# Patient Record
Sex: Male | Born: 1980 | Race: White | Hispanic: No | Marital: Single | Smoking: Current every day smoker
Health system: Southern US, Community
[De-identification: ages and names within clinical notes are randomized; demographics above are authoritative.]

## PROBLEM LIST (undated history)

## (undated) DIAGNOSIS — J45909 Unspecified asthma, uncomplicated: Secondary | ICD-10-CM

## (undated) DIAGNOSIS — I10 Essential (primary) hypertension: Secondary | ICD-10-CM

## (undated) DIAGNOSIS — M4300 Spondylolysis, site unspecified: Secondary | ICD-10-CM

## (undated) DIAGNOSIS — F79 Unspecified intellectual disabilities: Secondary | ICD-10-CM

## (undated) DIAGNOSIS — E119 Type 2 diabetes mellitus without complications: Secondary | ICD-10-CM

## (undated) DIAGNOSIS — F209 Schizophrenia, unspecified: Secondary | ICD-10-CM

## (undated) HISTORY — PX: WRIST SURGERY: SHX841

## (undated) HISTORY — PX: EYE SURGERY: SHX253

---

## 2002-11-05 ENCOUNTER — Encounter: Payer: Self-pay | Admitting: Emergency Medicine

## 2002-11-05 ENCOUNTER — Emergency Department (HOSPITAL_COMMUNITY): Admission: EM | Admit: 2002-11-05 | Discharge: 2002-11-05 | Payer: Self-pay | Admitting: Emergency Medicine

## 2002-11-18 ENCOUNTER — Encounter: Admission: RE | Admit: 2002-11-18 | Discharge: 2002-12-03 | Payer: Self-pay | Admitting: Orthopedic Surgery

## 2003-08-25 ENCOUNTER — Ambulatory Visit (HOSPITAL_BASED_OUTPATIENT_CLINIC_OR_DEPARTMENT_OTHER): Admission: RE | Admit: 2003-08-25 | Discharge: 2003-08-25 | Payer: Self-pay | Admitting: Ophthalmology

## 2004-02-28 ENCOUNTER — Ambulatory Visit: Payer: Self-pay | Admitting: Internal Medicine

## 2006-05-14 ENCOUNTER — Emergency Department (HOSPITAL_COMMUNITY): Admission: EM | Admit: 2006-05-14 | Discharge: 2006-05-14 | Payer: Self-pay | Admitting: Emergency Medicine

## 2006-09-20 ENCOUNTER — Emergency Department (HOSPITAL_COMMUNITY): Admission: EM | Admit: 2006-09-20 | Discharge: 2006-09-20 | Payer: Self-pay | Admitting: Emergency Medicine

## 2006-11-01 ENCOUNTER — Emergency Department (HOSPITAL_COMMUNITY): Admission: EM | Admit: 2006-11-01 | Discharge: 2006-11-02 | Payer: Self-pay | Admitting: Emergency Medicine

## 2007-01-27 ENCOUNTER — Emergency Department (HOSPITAL_COMMUNITY): Admission: EM | Admit: 2007-01-27 | Discharge: 2007-01-27 | Payer: Self-pay | Admitting: Emergency Medicine

## 2007-07-22 ENCOUNTER — Emergency Department (HOSPITAL_COMMUNITY): Admission: EM | Admit: 2007-07-22 | Discharge: 2007-07-23 | Payer: Self-pay | Admitting: Emergency Medicine

## 2008-03-20 ENCOUNTER — Emergency Department (HOSPITAL_COMMUNITY): Admission: EM | Admit: 2008-03-20 | Discharge: 2008-03-20 | Payer: Self-pay | Admitting: Emergency Medicine

## 2008-04-12 ENCOUNTER — Emergency Department (HOSPITAL_COMMUNITY): Admission: EM | Admit: 2008-04-12 | Discharge: 2008-04-13 | Payer: Self-pay | Admitting: Emergency Medicine

## 2008-06-06 ENCOUNTER — Inpatient Hospital Stay (HOSPITAL_COMMUNITY): Admission: EM | Admit: 2008-06-06 | Discharge: 2008-06-09 | Payer: Self-pay | Admitting: Emergency Medicine

## 2008-06-09 ENCOUNTER — Inpatient Hospital Stay (HOSPITAL_COMMUNITY): Admission: RE | Admit: 2008-06-09 | Discharge: 2008-06-12 | Payer: Self-pay | Admitting: Psychiatry

## 2008-06-09 ENCOUNTER — Ambulatory Visit: Payer: Self-pay | Admitting: Psychiatry

## 2008-07-14 ENCOUNTER — Encounter: Payer: Self-pay | Admitting: Emergency Medicine

## 2008-07-14 ENCOUNTER — Inpatient Hospital Stay (HOSPITAL_COMMUNITY): Admission: EM | Admit: 2008-07-14 | Discharge: 2008-07-18 | Payer: Self-pay | Admitting: Emergency Medicine

## 2009-12-06 ENCOUNTER — Ambulatory Visit (HOSPITAL_BASED_OUTPATIENT_CLINIC_OR_DEPARTMENT_OTHER): Admission: RE | Admit: 2009-12-06 | Discharge: 2009-12-06 | Payer: Self-pay | Admitting: Internal Medicine

## 2009-12-09 ENCOUNTER — Ambulatory Visit: Payer: Self-pay | Admitting: Internal Medicine

## 2010-06-05 LAB — DIFFERENTIAL
Basophils Absolute: 0.7 10*3/uL — ABNORMAL HIGH (ref 0.0–0.1)
Basophils Relative: 3 % — ABNORMAL HIGH (ref 0–1)
Eosinophils Absolute: 0 10*3/uL (ref 0.0–0.7)
Monocytes Relative: 4 % (ref 3–12)
Neutro Abs: 21.1 10*3/uL — ABNORMAL HIGH (ref 1.7–7.7)
Neutrophils Relative %: 82 % — ABNORMAL HIGH (ref 43–77)

## 2010-06-05 LAB — CBC
HCT: 38.3 % — ABNORMAL LOW (ref 39.0–52.0)
HCT: 38.6 % — ABNORMAL LOW (ref 39.0–52.0)
Hemoglobin: 13.2 g/dL (ref 13.0–17.0)
Hemoglobin: 13.5 g/dL (ref 13.0–17.0)
MCHC: 34.5 g/dL (ref 30.0–36.0)
MCHC: 35.2 g/dL (ref 30.0–36.0)
MCV: 95 fL (ref 78.0–100.0)
MCV: 95.1 fL (ref 78.0–100.0)
Platelets: 152 10*3/uL (ref 150–400)
RBC: 4.35 MIL/uL (ref 4.22–5.81)
RDW: 14.1 % (ref 11.5–15.5)
WBC: 17.1 10*3/uL — ABNORMAL HIGH (ref 4.0–10.5)

## 2010-06-05 LAB — POCT I-STAT, CHEM 8
Chloride: 107 mEq/L (ref 96–112)
Glucose, Bld: 90 mg/dL (ref 70–99)
HCT: 43 % (ref 39.0–52.0)
Hemoglobin: 14.6 g/dL (ref 13.0–17.0)
Potassium: 3.6 mEq/L (ref 3.5–5.1)
Sodium: 140 mEq/L (ref 135–145)

## 2010-06-05 LAB — BASIC METABOLIC PANEL
GFR calc non Af Amer: >60 mL/min (ref >60)
Glucose, Bld: 95 mg/dL (ref 70–99)
Potassium: 3.5 mEq/L (ref 3.5–5.1)
Sodium: 138 mEq/L (ref 135–145)

## 2010-06-06 LAB — RAPID URINE DRUG SCREEN, HOSP PERFORMED
Amphetamines: NOT DETECTED
Tetrahydrocannabinol: NOT DETECTED

## 2010-06-06 LAB — C-REACTIVE PROTEIN: CRP: 0.1 mg/dL — ABNORMAL LOW (ref <0.6)

## 2010-06-06 LAB — CBC
HCT: 43.4 % (ref 39.0–52.0)
HCT: 45 % (ref 39.0–52.0)
Hemoglobin: 14.9 g/dL (ref 13.0–17.0)
Hemoglobin: 15.7 g/dL (ref 13.0–17.0)
MCHC: 33.8 g/dL (ref 30.0–36.0)
MCHC: 34.3 g/dL (ref 30.0–36.0)
MCHC: 34.9 g/dL (ref 30.0–36.0)
MCV: 94.4 fL (ref 78.0–100.0)
MCV: 95.4 fL (ref 78.0–100.0)
Platelets: 158 10*3/uL (ref 150–400)
Platelets: 162 10*3/uL (ref 150–400)
Platelets: 185 10*3/uL (ref 150–400)
RBC: 4.74 MIL/uL (ref 4.22–5.81)
RDW: 14.9 % (ref 11.5–15.5)
RDW: 15 % (ref 11.5–15.5)
RDW: 15.2 % (ref 11.5–15.5)
RDW: 15.3 % (ref 11.5–15.5)
WBC: 11.2 10*3/uL — ABNORMAL HIGH (ref 4.0–10.5)

## 2010-06-06 LAB — COMPREHENSIVE METABOLIC PANEL
ALT: 14 U/L (ref 0–53)
AST: 18 U/L (ref 0–37)
Albumin: 3.2 g/dL — ABNORMAL LOW (ref 3.5–5.2)
Albumin: 3.8 g/dL (ref 3.5–5.2)
Alkaline Phosphatase: 57 U/L (ref 39–117)
Calcium: 9 mg/dL (ref 8.4–10.5)
Chloride: 107 mEq/L (ref 96–112)
Creatinine, Ser: 0.66 mg/dL (ref 0.4–1.5)
GFR calc Af Amer: >60 mL/min (ref >60)
Glucose, Bld: 86 mg/dL (ref 70–99)
Potassium: 3.7 mEq/L (ref 3.5–5.1)
Sodium: 140 mEq/L (ref 135–145)
Total Bilirubin: 0.5 mg/dL (ref 0.3–1.2)
Total Protein: 5.7 g/dL — ABNORMAL LOW (ref 6.0–8.3)
Total Protein: 6.7 g/dL (ref 6.0–8.3)

## 2010-06-06 LAB — URINALYSIS, ROUTINE W REFLEX MICROSCOPIC
Bilirubin Urine: NEGATIVE
Hgb urine dipstick: NEGATIVE
Protein, ur: NEGATIVE mg/dL
Urobilinogen, UA: 0.2 mg/dL (ref 0.0–1.0)

## 2010-06-06 LAB — DIFFERENTIAL
Basophils Absolute: 0.2 10*3/uL — ABNORMAL HIGH (ref 0.0–0.1)
Eosinophils Relative: 1 % (ref 0–5)
Lymphocytes Relative: 37 % (ref 12–46)
Lymphs Abs: 4.2 10*3/uL — ABNORMAL HIGH (ref 0.7–4.0)
Monocytes Absolute: 0.6 10*3/uL (ref 0.1–1.0)
Monocytes Relative: 5 % (ref 3–12)

## 2010-06-06 LAB — BASIC METABOLIC PANEL
BUN: 7 mg/dL (ref 6–23)
CO2: 27 mEq/L (ref 19–32)
Chloride: 109 mEq/L (ref 96–112)
Glucose, Bld: 99 mg/dL (ref 70–99)
Potassium: 4.1 mEq/L (ref 3.5–5.1)
Sodium: 139 mEq/L (ref 135–145)

## 2010-06-06 LAB — ETHANOL: Alcohol, Ethyl (B): 17 mg/dL — ABNORMAL HIGH (ref 0–10)

## 2010-06-06 LAB — SEDIMENTATION RATE: Sed Rate: 4 mm/hr (ref 0–16)

## 2010-06-11 LAB — POCT I-STAT, CHEM 8
Calcium, Ion: 1.19 mmol/L (ref 1.12–1.32)
Chloride: 101 mEq/L (ref 96–112)
Creatinine, Ser: 1.1 mg/dL (ref 0.4–1.5)
Glucose, Bld: 106 mg/dL — ABNORMAL HIGH (ref 70–99)
Potassium: 3.6 mEq/L (ref 3.5–5.1)

## 2010-06-12 LAB — DIFFERENTIAL
Basophils Absolute: 0.1 10*3/uL (ref 0.0–0.1)
Basophils Relative: 1 % (ref 0–1)
Eosinophils Absolute: 0.1 10*3/uL (ref 0.0–0.7)
Eosinophils Relative: 1 % (ref 0–5)
Lymphocytes Relative: 33 % (ref 12–46)
Monocytes Absolute: 0.8 10*3/uL (ref 0.1–1.0)

## 2010-06-12 LAB — CBC
HCT: 43.6 % (ref 39.0–52.0)
Hemoglobin: 15.1 g/dL (ref 13.0–17.0)
MCHC: 34.6 g/dL (ref 30.0–36.0)
MCV: 93.7 fL (ref 78.0–100.0)
Platelets: 173 10*3/uL (ref 150–400)
RDW: 14.1 % (ref 11.5–15.5)

## 2010-06-12 LAB — BASIC METABOLIC PANEL
BUN: 2 mg/dL — ABNORMAL LOW (ref 6–23)
CO2: 29 mEq/L (ref 19–32)
Chloride: 104 mEq/L (ref 96–112)
Glucose, Bld: 96 mg/dL (ref 70–99)
Potassium: 3.6 mEq/L (ref 3.5–5.1)
Sodium: 142 mEq/L (ref 135–145)

## 2010-06-12 LAB — RAPID URINE DRUG SCREEN, HOSP PERFORMED: Benzodiazepines: NOT DETECTED

## 2010-07-10 NOTE — Discharge Summary (Signed)
NAME:  Marcus Chang, Marcus Chang            ACCOUNT NO.:  [id number]   MEDICAL RECORD NO.:  [id number]          PATIENT TYPE:  INP   LOCATION:  1504                         FACILITY:  [facility]   PHYSICIAN:  Hillery Aldo, M.D.   DATE OF BIRTH:  Jan 21, 1981   DATE OF ADMISSION:  06/06/2008  DATE OF DISCHARGE:  06/09/2008                               DISCHARGE SUMMARY   DATE OF DISCHARGE:  June 09, 2008 pending bed availability at  [facility].   PRIMARY CARE PHYSICIAN:  Lonia Blood, M.D.   DISCHARGE DIAGNOSES:  1. Toxic encephalopathy secondary to alcohol, Xanax and Zyprexa      overdose.  2. Major depressive disorder.  3. Suicidal gesture.  4. History of bipolar disorder and schizophrenia.  5. Leukocytosis.  6. Tobacco abuse.  7. History of obsessive/compulsive disorder.  8. Mild mental retardation.   DISCHARGE MEDICATIONS:  1. Ativan 1 mg p.o. or IM q.4 h. p.r.n. agitation.  2. Nicoderm patch 21 mg transdermally daily.  3. Tylenol 650 mg p.o. q.4 h. p.r.n.   CONSULTATIONS:  Antonietta Breach, M.D. of Psychiatry.   BRIEF ADMISSION HISTORY OF PRESENT ILLNESS:  The patient is a 30-year-  old male with an extensive past psychiatric history who presented to the  hospital after an intentional overdose of both Zyprexa and Xanax in the  setting of heavy alcohol use.  He does claim that this was a suicide  attempt.  He was admitted for medical stabilization and clearance as  well as psychiatric consultation.  For the full details, please see the  dictated H and P.   PROCEDURES/DIAGNOSTIC STUDIES:  None.   LABORATORY DATA:  Discharge laboratory values:  White blood cell count  was 12.2, hemoglobin 15.7, hematocrit 45, platelets 168.  Chemistries  from June 08, 2008 were unremarkable with the exception of a mildly low  total protein at 5.7 and mildly low albumin at 3.2.   HOSPITAL COURSE:  1. Toxic encephalopathy secondary to alcohol, Xanax and Zyprexa      overdose:  The patient  was admitted and monitored closely for      medical stability.  His vital signs remained stable and sensorium      cleared.  Psychiatric consultation was requested and continued to      be followed closely medically.  At this point, he is medically      clear for discharge and plans are to send him to the [facility] for his underlying psychiatric illnesses.  2. Leukocytosis:  The patient has not complained of any shortness of      breath or cough suggestive of pneumonia.  He has not complained of      any dysuria.  Suspect this may have been to an aspiration      pneumonitis and no further diagnostic evaluation is felt to be      necessary at this time given that the patient is asymptomatic.  3. Major depression with history of bipolar disorder and schizophrenia      as well as obsessive/compulsive disorder:  The patient has been  seen and evaluated by Dr. Jeanie Sewer of Psychiatry and he does      recommend inpatient treatment for psychiatric illness at the      [facility].  The patient became agitated when this      plan was explained to him and adamantly insisted he wanted to go      home.  At this time, the patient is now agreeable to inpatient      therapy.  He continues to have poor impulse control.  4. Tobacco abuse:  The patient was provided with a nicotine patch.  5. Mild mental retardation:  The patient will need milieu environment      with strict limits and behavioral therapy.   DISPOSITION:  The patient is medically stable for transfer to the  [facility] and will be discharged when a bed is  identified.   Time spent coordinating care for discharge and discharge instructions  equals 35 minutes.      Hillery Aldo, M.D.  Electronically Signed     CR/MEDQ  D:  06/09/2008  T:  06/09/2008  Job:  [id number]   cc:   Lonia Blood, M.D.

## 2010-07-10 NOTE — Discharge Summary (Signed)
NAME:  Marcus Chang, Marcus Chang            ACCOUNT NO.:  [id number]   MEDICAL RECORD NO.:  [id number]          PATIENT TYPE:  INP   LOCATION:  5010                         FACILITY:  [facility]   PHYSICIAN:  Gabrielle Dare. Janee Morn, M.D.DATE OF BIRTH:  Mar 15, 1980   DATE OF ADMISSION:  07/14/2008  DATE OF DISCHARGE:  07/18/2008                               DISCHARGE SUMMARY   CONSULTANTS:  Madelynn Done, MD, Hand Surgery   DISCHARGE DIAGNOSES:  1. Fall from a bridge side down in embankment.  2. Pneumomediastinum.  3. Open left wrist fracture.  4. Mild mental retardation.  5. History of a psychotic disorder.  6. History of polysubstance abuse including ethyl alcohol abuse.  7. History of tobacco abuse.  8. Urinary retention, resolved.   PROCEDURES:  1. Open reduction and internal fixation.  2. Left distal radius fracture with treatment of intra-articular      fracture of 3 or more fragments.  3. Repair of traumatic laceration, left wrist of 3 cm, left wrist      brachioradialis tenotomy per Dr. Melvyn Novas.   HISTORY:  This is a 30 year old Caucasian male who reports that he fell  down in bridge embankment late on the evening of Jul 13, 2008, after  drinking with friends.  He denied any loss of consciousness.  He was  taken to [facility] by EMS complaining of left arm pain only.  On workup; however, he was found to have pneumomediastinum and an open  left wrist fracture and was transferred to the trauma service for  further evaluation and treatment.  Initial plain film chest x-ray had  been negative, C-spine plain films were negative C3-6 and C6-7, left  wrist film showed comminuted distal radius fracture and ulnar styloid  evulsion/dislocation.  C-spine CT scan was also negative.  Chest CT scan  showed pneumomediastinum.  There was some question of a small medial  left pneumothorax, but this was not clearly seen.   The patient was admitted for pain control mobilization and  evaluation by  Hand Surgery.  He was seen by Dr. Melvyn Novas for his distal radius fracture  and taken to the OR for ORIF of his intra-articular fracture.  He had  follow up pre and post procedure chest x-rays, none of which showed  significant residual pneumomediastinum or pneumothorax.  He initially  had some urinary retention postoperative, but this has since resolved.  He was treated with intravenous gentamicin and Ancef perioperatively,  and did not have any signs or symptoms of infection at the time of  discharge.  At this time, the patient is being prepared for discharge  home with the assistance of his girlfriend.  He had previously been  residing with his mother, but this is no longer a tenable situation and  therefore the girlfriend does agree that she will help care for the  patient.   MEDICATIONS AT TIME OF DISCHARGE:  1. Lexapro 10 mg p.o. daily.  2. Navane 2 mg p.o. at bedtime.  3. Atarax 50 mg p.o. at bedtime.  4. Percocet 5/325 mg 1-2 p.o. q.4 h. p.r.n. pain, #60 no  refill.   The patient is to follow with Dr. Melvyn Novas in 2 weeks.  He is to follow  up with trauma service on an as-needed basis.      Shawn Rayburn, P.A.      Gabrielle Dare Janee Morn, M.D.  Electronically Signed    SR/MEDQ  D:  07/18/2008  T:  07/19/2008  Job:  [id number]   cc:   Madelynn Done, MD  [facility]

## 2010-07-10 NOTE — Op Note (Signed)
NAME:  ADON, GEHLHAUSEN NO.:  [id number]   MEDICAL RECORD NO.:  [id number]          PATIENT TYPE:  INP   LOCATION:  3307                         FACILITY:  [facility]   PHYSICIAN:  Madelynn Done, MD  DATE OF BIRTH:  10/11/1980   DATE OF PROCEDURE:  07/14/2008  DATE OF DISCHARGE:                               OPERATIVE REPORT   PREOPERATIVE DIAGNOSIS:  Left comminuted distal radius fracture with  associated ulnar styloid fracture.   POSTOPERATIVE DIAGNOSIS:  Left comminuted distal radius fracture with  associated ulnar styloid fracture.   ATTENDING SURGEON:  Madelynn Done, MD, who scrubbed and present for  the entire procedure.   ASSISTANT SURGEON:  None.   PROCEDURE:  Left distal radius open treatment of intra-articular  fracture of 3 or more fragments with internal fixation.   RADIOGRAPHS:  1. Three views, stress radiography left wrist.  2. Left wrist brachioradialis tenotomy.  3. Stress radiography left wrist  4. Repair traumatic laceration left wrist 3 cm   SURGICAL IMPLANT:  Synthes volar distal radius plate with 5 locking pegs  distally,2.0 screws, 2.0-mm pegs, and one 2.0 fully-threaded locking  screw for a total 6 screws distally, and 3 bicortical 2.7-mm screws  proximally.   ANESTHESIA:  General via endotracheal tube.   TOURNIQUET TIME:  70 minutes with 250 mmHg.   SURGICAL INDICATIONS:  Mr. Woolbright is a 30 year old right-hand  dominant gentleman, who sustained an injury to his left distal radius  and ulna.  He was initially seen in the emergency department and reduced  and splinted.  He was recommended that he undergo the above procedure.  Risks, benefits, and alternatives were discussed in detail with the  patient, and signed informed consent was obtained.  Risks include, but  not limited to bleeding, infection, damage to nearby nerves, arteries or  tendons, nonunion, malunion, hardware failure, loss of motion of the  wrist and digits,  infection and need for further surgical intervention.   DESCRIPTION OF PROCEDURE:  The patient was properly identified in preop  holding area and marked with permanent marker made on the left wrist to  indicate correct operative site.  The patient was then brought back to  the operating room and placed supine on the anesthesia room table.  General anesthesia was administered via LMA.  The patient tolerated this  well.  A well-padded tourniquet was then placed on the left brachium and  sealed with a 1000 drape.  Left upper extremity was then prepped and  draped in normal sterile fashion.  A time-out was called and the correct  side was identified, and the procedure was then begun.  The patient did  have a 2-cm transverse wound over the volar aspect of the distal ulna.  This wound was then explored and it did not appear to be communicating  with the fracture site of the ulnar styloid or the distal radius.  It  appeared more of a skin tear and superficial in nature.  A longitudinal  incision was then made directly over the distal radius over the FCR.  Dissection was carried down through  the skin and subcutaneous tissues.  The FCR was identified.  Going through the floor of  the FCR, the FDL  was then identified.  The pronator quadratus was then raised, what  remained of the pronator quadratus in L-shaped fashion.  This allowed  for adequate exposure of the fracture site.  In order to obtain  reduction, the brachioradialis tenotomy was then carried out.  Fracture  reduction was then carried out.  The fracture site was then held  temporarily in place and reduced with a K-wire.  After this, the volar  distal radius plate was then fashioned.  Its plate height was then  confirmed after the oblong bicortical screw was then placed proximally.  Following this, attention was then turned to the distal fixation.  Five  distal locking pegs were then inserted with the appropriate 2.18 drill  bit, 2.0 mm  locking screws, nonthreaded pegs.  One fully-threaded screw  was then placed in the radial styloid.  After distal fixation was then  achieved, attention was then turned proximally, where 2 more bicortical  nonlocking screws were then placed, 2.7 mm.  Following this, the  fracture site reduced well.  There was an intra-articular distal radius  fracture of 3 more fragments and the fracture fragments reduced well  under ligamentotaxis and open reduction.  The wounds were then  thoroughly irrigated.  The pronator quadratus was then closed with a 2-0  Vicryl suture.  Final stress radiography was then carried out the  stressing the SL joint as well as the distal radioulnar joint.  There  was no instability to distal radioulnar joint after fixation of distal  radius as well as the SL joint under live imaging.  Three views of the  wrist were then obtained.  Final images were then printed out.  Pronator  quadratus was closed with 2-0 Vicryl.  The subcutaneous tissues closed  with 4-0 Vicryl and the skin closed with 3-0 nylon with horizontal  mattress running suture.  A 20 mL of 0.25% Marcaine were then  infiltrated locally.  A sterile compressive and Xeroform dressing was  then applied.  The laceration over the volar ulnar aspect was then  debrided, and a traumatic laceration was then closed with 3-0 nylon  sutures.  A 4x4s and soft roll and well-padded sugar-tong splint were  then applied.  The patient was extubated and taken to recovery room in  good condition.   INTRAOPERATIVE RADIOGRAPH:  Three views of the wrist do show the  internal fixation in place.  There was good alignment of the radial  height, tilt, and inclination with good alignment of the internal  fixation.   POSTOPERATIVE PLAN:  The patient will be admitted to the trauma service,  to be discharged when his pain is controlled.  I plan to see him back in  the office in 2 weeks for wound check, suture removal, x-rays, and then   transitioned  into a long-arm cast for a total of 4 weeks and 2 weeks in  a short-arm cast.      Madelynn Done, MD  Electronically Signed     FWO/MEDQ  D:  07/14/2008  T:  07/15/2008  Job:  [id number]

## 2010-07-10 NOTE — Consult Note (Signed)
NAME:  YSIDRO, RAMSAY            ACCOUNT NO.:  [id number]   MEDICAL RECORD NO.:  [id number]          PATIENT TYPE:  INP   LOCATION:  1504                         FACILITY:  [facility]   PHYSICIAN:  Antonietta Breach, M.D.  DATE OF BIRTH:  06-23-1980   DATE OF CONSULTATION:  06/06/2008  DATE OF DISCHARGE:                                 CONSULTATION   REASON FOR CONSULTATION:  Overdose.   REQUESTING PHYSICIAN:  [facility]   HISTORY OF PRESENT ILLNESS:  Mr. Valerio Pinard is a 30 year old male  admitted the [facility] on June 05, 2008, due to an overdose  of Zyprexa and Xanax.   Mr. Cruces states that he was feeling like no one cared about him.  His mood was depressed.  He took an overdose of Zyprexa and Xanax in a  suicide attempt and presented to the [facility] Emergency Room early  this morning.   He also has been drinking beer and states that he would drink beer every  day if he had the money.  It is unclear how many beers he drank on the  day of the overdose.   His alcohol level was 17 by the time he arrived to the emergency room.   He does have intact memory and orientation.  He is noncombative.  He has  been undergoing regular outpatient psychotropic care by an outpatient  psychiatrist.  He is treated with Zyprexa.  He also has been prescribed  Xanax.   He told the admitting physician that he drinks up to 6-8 beers when he  does drink.   PAST PSYCHIATRIC HISTORY:  In review of the past medical record, Mr.  Norrod reported to the emergency room in December 2008 with altered  mental status.  At that time, he had agitation and anxiety.  He was  being treated with Geodon at that time unsuccessfully.   Mr. Dozal states that he has had difficulty with mood outbursts  since he was very young.  He was on Thorazine for a long time.   His psychotropic trials include Thorazine, Haldol, Depakote and Geodon.  He does not recall lithium.   He states  that he is followed by a psychiatrist in [facility] with an  [place] last name that he cannot recall.  He also states that he is  followed by a Veterinary surgeon at [employer] [facility] [employer] [facility].  He states that the counselor is for  him and his mother.   Obsessive-compulsive disorder is listed in Mr. Youman medical  record.   FAMILY PSYCHIATRIC HISTORY:  None known.   SOCIAL HISTORY:  Mr. Blahut has lived in several group homes.  He  states that he is living at home now.  He is unemployed.  He smokes 2  packs of cigarettes per day.  Please see the above regarding alcohol.  He does not use any illegal drugs.   PAST MEDICAL HISTORY:  Status post Zyprexa and Xanax overdose.   PAST SURGICAL HISTORY:  He does have a surgical history in June 2005.  He was treated for strabismus and underwent ophthalmic surgery in the  left orbit.  MEDICATIONS:  The MAR is reviewed.  Mr. Xu is on Ativan 0.5 mg  q.6 h. p.r.n.   ALLERGIES:  He has no known drug allergies.   LABORATORY DATA:  Urine drug screen negative.  Aspirin negative, Tylenol  negative, alcohol as above.  Sodium 140, BUN 4, creatinine 0.66, glucose  97, SGOT 18, SGPT 15, WBC 11.2, hemoglobin 15.4, platelet count 185.  Drug screen in February 2010 was positive for tetrahydrocannabinol.   REVIEW OF SYSTEMS:  CONSTITUTIONAL, HEAD/EYES/EARS/NOSE/THROAT, MOUTH,  NEUROLOGIC, PSYCHIATRIC, CARDIOVASCULAR, RESPIRATORY, GASTROINTESTINAL,  GENITOURINARY, SKIN, MUSCULOSKELETAL, HEMATOLOGIC, LYMPHATIC, ENDOCRINE,  METABOLIC:  All unremarkable.   PHYSICAL EXAMINATION:  VITAL SIGNS:  Temperature 98.2, pulse 83,  respiratory rate 18, blood pressure 104/65, O2 saturation on room air  98%.  GENERAL APPEARANCE:  Mr. Zarcone is a young male lying in a supine  position in his hospital bed with no abnormal involuntary movements.   MENTAL STATUS EXAMINATION:  Mr. Kras is alert.  His eye contact is  intermittent.  His affect is constricted.  Mood is  depressed.  Concentration is mildly decreased.  He is oriented to all spheres.  His  memory is intact to immediate, recent and remote.  Fund of knowledge and  intelligence are within normal limits.  His speech involves normal rate  and prosody without dysarthria.  Thought process is logical, coherent  and goal directed, no looseness of associations.  Thought content:  He  does acknowledge suicidal intent.  Insight:  Insight is intact for his  mood instability.  He pauses when asked if he has an alcohol problem and  then acknowledges that he does.  His judgment is impaired.   ASSESSMENT:  Axis I:  293.83.  Mood disorder, not otherwise specified.  Rule out alcohol dependence.  Axis II:  Deferred.  Axis III:  See past medical history.  Axis IV:  Primary support group.  Axis V:  30.   Mr. Turay is at risk for suicide.  He also could benefit from a  dual-diagnosis inpatient treatment course.   I recommend that Mr. Herendeen be admitted to an inpatient psychiatric  unit for a dual-diagnosis track.   For anti-agitation, would utilize Ativan 0.5 to 2 mg p.o. IM or IV q.4  h. p.r.n. with caution concerning sedation or ataxia.   Would provide low-stimulation ego support.   Will defer other psychotropic trials to his inpatient psychiatric stay.      Antonietta Breach, M.D.  Electronically Signed     JW/MEDQ  D:  06/06/2008  T:  06/06/2008  Job:  [id number]

## 2010-07-10 NOTE — H&P (Signed)
NAME:  Marcus Chang, Marcus Chang            ACCOUNT NO.:  [id number]   MEDICAL RECORD NO.:  [id number]          PATIENT TYPE:  INP   LOCATION:  1504                         FACILITY:  [facility]   PHYSICIAN:  Theodosia Paling, MD    DATE OF BIRTH:  12-21-1980   DATE OF ADMISSION:  06/06/2008  DATE OF DISCHARGE:                              HISTORY & PHYSICAL   PRIMARY CARE PHYSICIAN:  Dr. Lonia Blood.   CHIEF COMPLAINT:  Drug overdose.   HISTORY OF PRESENT ILLNESS:  Marcus Chang is a 30 year old gentleman  who has a history of compulsive disorder and mood disorder who followed  up with Dr. Mikeal Hawthorne.  He has been under recent stress and he overdosed  himself with Zyprexa of unknown quantity.  He does not remember the dose  or the number of tablets he consumed, but according to him, he took the  whole bottle.  He wanted to hurt himself, currently denies suicidal  ideation.  In the emergency room, he was hemodynamically stable.  [facility] service was contacted for further evaluation and  management.   PAST MEDICAL HISTORY:  1 . Mood disorder.  1. History of obsessive compulsive disorder.   HOME MEDICATION:  1. Unknown dose of Zyprexa.  2. Unknown dose of Xanax.   ALLERGIES:  None according to him.   PAST SURGICAL HISTORY:  He had been operated on the right thigh.   FAMILY HISTORY:  No history of suicidal ideation, depression, stroke or  heart attack in the family.   SOCIAL HISTORY:  The patient currently does not work.  He smokes two  packs a day since he was a 30 year old of cigarettes.  He occasionally  drinks alcohol.  Is not able to specify how frequently, but he drinks up  to 6-8 beers, he states, when he has money, and he is happy he drinks  alcohol.  Denies IV drug, marijuana or cocaine intake.   REVIEW OF SYSTEMS:  The patient is very, very anxious, admits that he  was wanting to hurt himself.  Denies suicidal ideation at this time.  Essentially, review of systems  negative except for what is mentioned up  at the HPI and positive for anxiety.   LABORATORY DATA:  WBC 11.2, hemoglobin 15.4, hematocrit 44.3, platelet  count 185, serum sodium 140, potassium 3.4, chloride 107, bicarbonate  24, glucose 97, BUN 4, creatinine 0.66.  Serum Tylenol level less than  10.0, alcohol 17, UA micro negative.  Urine drug screen negative.   PHYSICAL EXAMINATION:  VITAL SIGNS:  Febrile, blood pressure 116/63,  sats 96% at room air, heart rate 116, oxygen saturation 98% at room air,  breathing 16-18.  GENERAL:  No acute cardiorespiratory distress.  The patient appears very  anxious and very much restless.  LUNGS:  Normal breath sounds.  No rhonchi or crepitation.  CARDIOVASCULAR:  S1, S2 normal.  No murmur, gallop, rub.  GI:  Soft, nontender.  No organomegaly.  EXTREMITIES:  No edema.  PSYCHIATRIC:  Oriented x3.  CNS:  Follows command.  Motor sensory appears to be intact.   LABORATORY DATA:  As mentioned above.  No chest x-ray is available for  review.  CT scan of the brain has not been done.   ASSESSMENT AND PLAN:  1. Drug overdose.  The patient will be admitted to telemetry.  At this      time, the patient appears to be hemodynamically stable.  EKG      appears to be normal sinus rhythm.  Vitals will be observed in the      patient.  Psych consult will be performed in the morning for      evaluation of depression.  The patient has a lot of stressors at      home according to him.  2. History of mood disorder.  Psych consult in the morning.  3. Prophylaxis.  DVT on board.   CODE STATUS:  The patient is full code.   TOTAL TIME OF DISCHARGE:  Total time spent in admission of this patient  45 minutes.      Theodosia Paling, MD  Electronically Signed     NP/MEDQ  D:  06/06/2008  T:  06/06/2008  Job:  [id number]   cc:   Lonia Blood, M.D.

## 2010-07-10 NOTE — H&P (Signed)
NAME:  Marcus Chang, Marcus Chang            ACCOUNT NO.:  [id number]   MEDICAL RECORD NO.:  [id number]          PATIENT TYPE:  INP   LOCATION:  3307                         FACILITY:  [facility]   PHYSICIAN:  Gabrielle Dare. Janee Morn, M.D.DATE OF BIRTH:  [birth year]   DATE OF ADMISSION:  07/14/2008  DATE OF DISCHARGE:                              HISTORY & PHYSICAL   CHIEF COMPLAINT:  Left wrist pain after fall.   HISTORY OF PRESENT ILLNESS:  The patient is a 30 year old white male who  was walking when he slipped on some mud and fell down an embankment last  night.  He had no loss of consciousness.  He was taken by EMS to [facility].  He initially complained of left arm pain only.  Workup  was done.  He was found to have an open left wrist fracture and  pneumomediastinum.  He was transferred to the Trauma Service to [facility].  He complains of some occasional chest pain, but mostly this has  left wrist pain with no other complaints.   PAST MEDICAL HISTORY:  Psychotic disorder, not otherwise specified.   PAST SURGICAL HISTORY:  Eye surgery.   SOCIAL HISTORY:  He occasionally smokes marijuana.  He smokes one half-  pack of cigarettes per day.  He denies drinking alcohol.  He lives with  his mother, and he is disabled.   ALLERGIES:  No known drug allergies.   MEDICATIONS:  1. Lexapro 10 mg daily.  2. Navane 2 mg p.o. nightly.  3. Vistaril 50 mg p.o. nightly.   REVIEW OF SYSTEMS:  That demonstrates chronic eye problems and mild  mental retardation.  No other significant findings.   PHYSICAL EXAMINATION:  VITAL SIGNS:  Temperature 96.6, pulse 82,  respirations 20, blood pressure 126/72, saturations 99% on room air.  HEENT:  He has minor facial abrasions.  Eyes, he has some chronic  nystagmus.  Ears have cerumen in external canal bilaterally.  Face is  symmetric and nontender.  NECK:  Supple with no tenderness or step-offs.  LUNGS:  Clear to auscultation.  There is no significant  wheezing.  No  subcutaneous emphysema is felt.  CARDIOVASCULAR:  Heart is regular with no murmurs.  Impulses palpable in  left chest.  Distal pulses are 2+ throughout with no peripheral edema.  ABDOMEN:  Soft and nontender.  Bowel sounds are hypoactive.  There is no  organomegaly noted.  No masses are felt.  Pelvis is stable anteriorly.  MUSCULOSKELETAL:  He has left upper extremity in a sugar-tong splint  with neurovascular seems intact.  He does have abrasions to his knees  which may be old.  BACK:  He has no step-offs or tenderness.  NEUROLOGIC:  Glasgow Coma Scale is 15.  He is oriented and follows  commands, moves all extremities.   LABORATORY STUDIES:  Sodium 140, potassium 3.6, chloride 107, CO2 of 21,  BUN 3, creatinine 1.1, glucose 90.  White blood cell count 25.7,  hemoglobin 14.3, platelets 152.  Alcohol level 59.   Chest x-ray, negative cervical spine films, negative through C6-7.  Left  wrist film  shows comminuted distal radius fracture and ulnar styloid  avulsion dislocation.  CT scan of the cervical spine negative.  CT scan  of the chest shows pneumomediastinum with a questionable small left  medial pneumothorax.   IMPRESSION:  Status post fall with:  1. Pneumomediastinum and questionable tiny left hemothorax.  2. Open left wrist fracture with distal radioulnar joint injury.  3. Alcohol use and cannabis use.  4. Tobacco abuse.  5. History of mental retardation.  6. History of psychotic disorder.   PLAN:  Admit to the Trauma Service and Stepdown Unit.  We will recheck  chest x-ray at noon to make sure he is not developing a visible  pneumothorax.  He has been seen in consultation by Dr. Melvyn Novas from [facility] who plans an open reduction and internal fixation of left wrist  today.      Gabrielle Dare Janee Morn, M.D.  Electronically Signed     BET/MEDQ  D:  07/14/2008  T:  07/15/2008  Job:  [id number]   cc:   Madelynn Done, MD

## 2010-07-10 NOTE — H&P (Signed)
NAME:  Marcus Chang, COZZOLINO NO.:  [id number]   MEDICAL RECORD NO.:  [id number]          PATIENT TYPE:  IPS   LOCATION:  0506                          [facility]   PHYSICIAN:  Anselm Jungling, MD  DATE OF BIRTH:  [birth year]   DATE OF ADMISSION:  06/09/2008  DATE OF DISCHARGE:                       PSYCHIATRIC ADMISSION ASSESSMENT   IDENTIFICATION:  A 30 year old male.  This is a voluntary admission.   HISTORY OF PRESENT ILLNESS:  This is first inpatient [facility] admission for  Marcus Chang who is 30 years old and has a history of learning disabilities and  lives with his mother.  He presented after taking an unknown amount of  alprazolam and Zyprexa in the emergency room, acknowledging a lot of  stress and tension between his mother and his girlfriend, and had  apparently been arguing with his mother.  He reports that he has been  drinking alcohol several times a week and was under the influence of  alcohol when he took the overdose.  He reports that he regrets the  overdose today, is not sure what he was thinking at the time.  Can not  tell me exactly how many beers he drank that day.  Says that his plan is  to stay off alcohol.  He does take Zyprexa and is prescribed 5 mg three  times a day.  Also prescribed Xanax 1 mg three times a day.  Unable to  tell us how much she took with the overdose.  He was initially admitted  to the medical unit where he was stabilized from April 12-15, 2010.  Please see the discharge summary dictated by Dr. Lonia Blood, his  primary care physician.   PAST PSYCHIATRIC HISTORY:  Reports that he sees someone as an outpatient  at [facility] and has a history of multiple  admissions to [facility].  Also reports a  history of admissions to [facility], [facility] and  [facility] several times.  This is his first [facility] admission.  He  reports a history of learning  disabilities and spent several years in  group homes, endorses abusing alcohol and states I have an alcohol  problem.  He endorses prior suicide attempts.  The patient has a  history of a previous trial of a valproic acid which he says was  discontinued a couple of years ago for reasons that are not clear and  also Geodon in the past.   SOCIAL HISTORY:  Single Caucasian male.  No children.  Has a girlfriend  here in town.  Currently, residing with his mother here in town.  Previously resided in group homes.  Reports his mother would like him to  be get into assisted living and that is what they are working on.  No  legal problems.  He reports that he is on disability.  Endorses friction  at home between his mom and his girlfriend.   FAMILY HISTORY:  Remarkable for mother with a history of alcohol abuse  in remission, father with a history of alcoholism.   MEDICAL HISTORY:  Primary  care physician is Lonia Blood, M.D.   MEDICAL PROBLEMS:  Status post olanzapine overdose.   CURRENT MEDICATIONS:  Validated with his pharmacy at [phone], that is  [facility].  1. Alprazolam 1 mg t.i.d.  2. Zyprexa Zydis 5 mg t.i.d.   DRUG ALLERGIES:  None.   PHYSICAL EXAMINATION:  GENERAL:  Physical examination done in the  medical unit is noted in their records.  This a fully alert, compact  built male in no physical distress on admission to our unit.  VITAL SIGNS:  He is 5 feet 4 inches tall, 130 pounds, temperature 97.4,  pulse 80, respirations 20, blood pressure 115/74, pleasant cooperative,  fully oriented.  Mental status exam reveals an alert, fully oriented 7-  year-old with appropriate affect, polite.  He has displayed no agitation  or impulsivity here on the unit.  Insight is minimal, but he does state,  I have an alcohol problem.  Speech appears to be at baseline, rather  concrete, but normal in form and production and tone.  Mood is neutral  today.  Thought process logical and coherent.  Has  been interacting  appropriately in groups and on the unit.  Says he is glad he is alive  regrets that the overdose is not relieving, unsure why he took an  overdose.  Admits that he is been getting a lot of feedback about his  alcohol use and recognizes that it would be better to stay off of it.  Unable to describe the relapse triggers or different factors that might  be contributing to his drinking.  He is oriented times four.  No  evidence of psychosis or delirium or confusion.  Memory is intact.   DISCHARGE DIAGNOSES:  AXIS I:  Mood disorder NOS.  Alcohol abuse.  AXIS II:  Deferred.  AXIS III:  Status post olanzapine and alprazolam overdose.  AXIS IV:  Moderate to severe relationship stressors.  AXIS V:  Current 40, past year not known.   PLAN:  Plan is to voluntarily admit him to our dual diagnosis program.  Because of his history of alcohol abuse, at this point, we will not  resume his alprazolam.  This is day #6.  Since he has overdosed,  __________and he shows no signs of withdrawal, so he does not require  any further detox at this point.  After team conference, we agreed to  restart his Zyprexa at just 5 mg q.h.s. and hope to hear from his  mother.      Margaret A. Lorin Picket, N.P.      Anselm Jungling, MD  Electronically Signed    MAS/MEDQ  D:  06/10/2008  T:  06/10/2008  Job:  [id number]

## 2010-07-13 NOTE — Discharge Summary (Signed)
NAME:  Marcus Chang, GINSBERG NO.:  [id number]   MEDICAL RECORD NO.:  [id number]          PATIENT TYPE:  IPS   LOCATION:  0506                          [facility]ACILITY[facility]  [facility]   PHYSICIAN:  Anselm Jungling, MD  DATE OF BIRTH:  11-Feb-1981   DATE OF ADMISSION:  06/09/2008  DATE OF DISCHARGE:  06/12/2008                               DISCHARGE SUMMARY   IDENTIFYING DATA AND REASON FOR ADMISSION:  This was an inpatient  psychiatric admission for [name], a 30 year old Caucasian male admitted  after an overdose suicide attempt, which occurred while he was  intoxicated.  Please refer to the admission note for further details  pertaining to the symptoms, circumstances and history that led to his  hospitalization.  He was given an initial Axis I diagnosis of depressive  disorder NOS and alcohol abuse.   MEDICAL AND LABORATORY:  The patient came to [place] in good health, without  any active or chronic medical problems.  He was medically and physically  assessed by the psychiatric physician's assistant.  There were no  significant medical issues.  He had been treated for his overdose in the  medical setting.   HOSPITAL COURSE:  The patient was admitted to the adult inpatient  psychiatric service.  He presented as a well-nourished, normally-  developed male who was alert, fully oriented, pleasant and verbalized a  strong desire for help.  He acknowledged his depression, but denied  convincingly any suicidal ideation.   He was involved in the milieu program including therapeutic groups and  activities.  By the second hospital day, he was reporting that he felt  great.  He had slept well.  He continued to deny any suicidal  ideation.   He did not appear to go through any alcohol withdrawal symptomatology  during his stay.   On the third day, the patient requested discharge, stating that he still  felt much better.  His plan was to return home with his mother.   He had been started on a  trial of low-dose Zyprexa at bedtime to address  insomnia and agitation.  This was very agreeable to him, and he wanted  to continue it upon discharge.   DISCHARGE AND AFTERCARE PLAN:  The patient was provided an aftercare  plan, involving follow up for his medication treatment with Zyprexa.   DISCHARGE DIAGNOSES:  AXIS I:  Mood disorder NOS and alcohol abuse.  AXIS II:  Deferred.  AXIS III:  No acute or chronic illnesses.  AXIS IV:  Stressors severe.  AXIS V:  GAF on discharge 65.   DISCHARGE MEDICATIONS:  Zyprexa 5 mg q.h.s.      Anselm Jungling, MD  Electronically Signed     SPB/MEDQ  D:  06/23/2008  T:  06/23/2008  Job:  [id number]

## 2010-07-13 NOTE — Op Note (Signed)
NAME:  TRAVONNE, SCHOWALTER                        ACCOUNT NO.:  [id number]   MEDICAL RECORD NO.:  [id number]                   PATIENT TYPE:  AMB   LOCATION:  DSC                                  FACILITY:  [facility]   PHYSICIAN:  Casimiro Needle A. Karleen Hampshire, M.D.            DATE OF BIRTH:  January 31, 1981   DATE OF PROCEDURE:  08/25/2003  DATE OF DISCHARGE:                                 OPERATIVE REPORT   PREOPERATIVE DIAGNOSES:  1. Consecutive exotropia.  2. Diplopia.  3. Intermittent nystagmus.  4. Status post extraocular muscle surgery with medial rectus recessions.   SURGEON:  Tyrone Apple. Karleen Hampshire, M.D.   ANESTHESIA:  General with laryngeal mask airway.   PROCEDURE:  1. Left medial rectus resect and advance, resection of 4 mm and advancement     of 4 mm.  2. Left inferior oblique myectomy.  3. Right medial rectus resection of 4 mm on adjustable suture.  4. Right inferior oblique myectomy.   POSTOPERATIVE DIAGNOSIS:  Status post repair of strabismus.   INDICATION FOR PROCEDURE:  Novah Goza is a 30 year old white male with  a history of congenital strabismus and treatment for the same with  extraocular muscle surgery.  The patient has a consecutive exotropia of 45  prism diopters with diplopia and V-pattern inferior oblique overactions  bilaterally and an intermittent periodic nystagmus.  This procedure is  indicated to restore single binocular vision and restore the alignment of  the visual axis.  The risks and benefits of the procedure were explained to  the patient and the patient's parents prior to the procedure.  Informed  consent was obtained.   DESCRIPTION OF TECHNIQUE:  The patient was taken into the operating room and  placed in a supine position.  After the induction of general anesthesia and  establishment laryngeal mask airway, our attention was first turned to the  left eye.  Forced duction tests were performed and found to be negative.  The globe was then held in the  inferior temporal fornix.  The eye was then  elevated and adducted.  An incision was then made through the inferior  temporal fornix, taken down to the posterior sub-Tenon's space, and the left  lateral rectus muscle was isolated on a Stevens hook, subsequently on a  Green hook.  Two Sevens hooks were then placed in the incision site and  these were later replaced with a Technical sales engineer.  The inferior left  oblique was then isolated coursing from its origin in the anterior floor of  the orbit to its insertion in the inferior posterior temporal quadrant of  the globe.  It was then carefully dissected free from its overlying muscle  fascia and intermuscular septum were cut to expose the tenon for  approximately 10 mm.  A second Green hook was then passed beneath the tendon  and a 10 mm myectomy of the inferior oblique was performed.  Attention was  then turned  to the left medial rectus muscle and the globe was then held in  the inferior nasal fornix.  The eye was elevated and abducted.  An incision  was made through the inferior nasal fornix, taken down to the posterior sub-  Tenon's space, and the left medial rectus muscle was then isolated on a  Stevens hook, subsequently on the [employer].  It was found this muscle had  been operated and had been recessed approximately 5.5 mm from its native  insertion.  The muscle was then carefully dissected free from its overlying  muscle fascia.  The intermuscular septum were cut.  A mark was placed on the  tendon at 4 mm from its insertion and the muscle tendon was then imbricated  at the preplaced mark using 6-0 Vicryl suture and taking two locking bites  of the ends.  The muscle was then transected proximal to the preplaced  sutures and the transected tendon was then advanced to 6 mm from the limbus.  It was reattached to the globe using a preplaced suture.  The suture was  tied securely and the conjunctiva was repositioned.  Effectively the  muscle  on the tendon was transected 4 mm and advanced 4 mm for a total mechanical  gain of 8 mm.  Our attention was then turned to the fellow right eye, where  an identical right inferior oblique myectomy was performed using the  technique outlined above, and the right medial rectus recession was  performed using the technique outlined above with the exception that the  point of reattachment of the tendon to the globe.  The tendon was reattached  in an adjustable suture fashion for subsequent adjustment.  The adjustable  sutures were reposited underneath the conjunctiva and a double pressure  patch was applied.  There were no complications.  At the conclusion of the  procedure the patient was awakened from anesthesia and transferred to the  recovery room awake and in stable condition.  The patient was subsequently  adjusted to orthophoria in the recovery room.                                               Casimiro Needle A. Karleen Hampshire, M.D.    MAS/MEDQ  D:  08/25/2003  T:  08/25/2003  Job:  [id number]

## 2010-11-21 LAB — POCT I-STAT, CHEM 8
BUN: 10
Chloride: 102
Sodium: 137

## 2010-11-21 LAB — ETHANOL: Alcohol, Ethyl (B): <5

## 2010-11-21 LAB — RAPID URINE DRUG SCREEN, HOSP PERFORMED
Amphetamines: NOT DETECTED
Barbiturates: NOT DETECTED
Benzodiazepines: POSITIVE — AB
Opiates: NOT DETECTED

## 2010-12-03 LAB — CBC
Hemoglobin: 14.3
RBC: 4.58
RDW: 13.6

## 2010-12-03 LAB — RAPID URINE DRUG SCREEN, HOSP PERFORMED: Tetrahydrocannabinol: NOT DETECTED

## 2010-12-03 LAB — ETHANOL: Alcohol, Ethyl (B): <5

## 2012-04-21 ENCOUNTER — Emergency Department (HOSPITAL_COMMUNITY)
Admission: EM | Admit: 2012-04-21 | Discharge: 2012-04-21 | Disposition: A | Discharge status: Home / Self Care | Payer: Medicaid Other | Source: Home / Self Care | Attending: Family Medicine | Admitting: Family Medicine

## 2012-04-30 ENCOUNTER — Other Ambulatory Visit: Payer: Self-pay | Admitting: Internal Medicine

## 2012-04-30 ENCOUNTER — Ambulatory Visit
Admission: RE | Admit: 2012-04-30 | Discharge: 2012-04-30 | Disposition: A | Discharge status: Home / Self Care | Payer: Medicaid Other | Source: Ambulatory Visit | Attending: Internal Medicine | Admitting: Internal Medicine

## 2012-04-30 DIAGNOSIS — M79609 Pain in unspecified limb: Secondary | ICD-10-CM

## 2013-05-25 ENCOUNTER — Other Ambulatory Visit: Payer: Self-pay | Admitting: Orthopaedic Surgery

## 2013-05-25 DIAGNOSIS — M4317 Spondylolisthesis, lumbosacral region: Secondary | ICD-10-CM

## 2013-06-10 ENCOUNTER — Ambulatory Visit
Admission: RE | Admit: 2013-06-10 | Discharge: 2013-06-10 | Disposition: A | Discharge status: Home / Self Care | Payer: Medicaid Other | Source: Ambulatory Visit | Attending: Orthopaedic Surgery | Admitting: Orthopaedic Surgery

## 2013-06-10 DIAGNOSIS — M4317 Spondylolisthesis, lumbosacral region: Secondary | ICD-10-CM

## 2015-04-21 ENCOUNTER — Emergency Department (HOSPITAL_COMMUNITY)
Admission: EM | Admit: 2015-04-21 | Discharge: 2015-04-21 | Disposition: A | Discharge status: Home / Self Care | Payer: Medicaid Other | Attending: Emergency Medicine | Admitting: Emergency Medicine

## 2015-04-21 ENCOUNTER — Encounter (HOSPITAL_COMMUNITY): Payer: Self-pay

## 2015-04-21 DIAGNOSIS — Z8739 Personal history of other diseases of the musculoskeletal system and connective tissue: Secondary | ICD-10-CM | POA: Insufficient documentation

## 2015-04-21 DIAGNOSIS — H5500 Unspecified nystagmus: Secondary | ICD-10-CM | POA: Insufficient documentation

## 2015-04-21 DIAGNOSIS — R55 Syncope and collapse: Secondary | ICD-10-CM

## 2015-04-21 DIAGNOSIS — E119 Type 2 diabetes mellitus without complications: Secondary | ICD-10-CM | POA: Insufficient documentation

## 2015-04-21 DIAGNOSIS — Z8659 Personal history of other mental and behavioral disorders: Secondary | ICD-10-CM | POA: Diagnosis not present

## 2015-04-21 DIAGNOSIS — F17210 Nicotine dependence, cigarettes, uncomplicated: Secondary | ICD-10-CM | POA: Insufficient documentation

## 2015-04-21 DIAGNOSIS — J45909 Unspecified asthma, uncomplicated: Secondary | ICD-10-CM | POA: Diagnosis not present

## 2015-04-21 DIAGNOSIS — R42 Dizziness and giddiness: Secondary | ICD-10-CM | POA: Diagnosis not present

## 2015-04-21 HISTORY — DX: Type 2 diabetes mellitus without complications: E11.9

## 2015-04-21 HISTORY — DX: Unspecified intellectual disabilities: F79

## 2015-04-21 HISTORY — DX: Schizophrenia, unspecified: F20.9

## 2015-04-21 HISTORY — DX: Spondylolysis, site unspecified: M43.00

## 2015-04-21 HISTORY — DX: Unspecified asthma, uncomplicated: J45.909

## 2015-04-21 LAB — BASIC METABOLIC PANEL
ANION GAP: 13 (ref 5–15)
BUN: 8 mg/dL (ref 6–20)
CHLORIDE: 97 mmol/L — AB (ref 101–111)
CO2: 27 mmol/L (ref 22–32)
Calcium: 9.8 mg/dL (ref 8.9–10.3)
Creatinine, Ser: 0.9 mg/dL (ref 0.61–1.24)
GFR calc Af Amer: >60 mL/min (ref >60)
GLUCOSE: 96 mg/dL (ref 65–99)
POTASSIUM: 5 mmol/L (ref 3.5–5.1)
Sodium: 137 mmol/L (ref 135–145)

## 2015-04-21 LAB — URINALYSIS, ROUTINE W REFLEX MICROSCOPIC
BILIRUBIN URINE: NEGATIVE
Glucose, UA: NEGATIVE mg/dL
Hgb urine dipstick: NEGATIVE
KETONES UR: NEGATIVE mg/dL
LEUKOCYTES UA: NEGATIVE
NITRITE: NEGATIVE
PH: 6.5 (ref 5.0–8.0)
Protein, ur: NEGATIVE mg/dL
SPECIFIC GRAVITY, URINE: 1.007 (ref 1.005–1.030)

## 2015-04-21 LAB — CBG MONITORING, ED: GLUCOSE-CAPILLARY: 86 mg/dL (ref 65–99)

## 2015-04-21 LAB — CBC WITH DIFFERENTIAL/PLATELET
BASOS ABS: 0 10*3/uL (ref 0.0–0.1)
BASOS PCT: 0 %
EOS PCT: 1 %
Eosinophils Absolute: 0.1 10*3/uL (ref 0.0–0.7)
HEMATOCRIT: 44.1 % (ref 39.0–52.0)
HEMOGLOBIN: 15.2 g/dL (ref 13.0–17.0)
LYMPHS PCT: 25 %
Lymphs Abs: 3.5 10*3/uL (ref 0.7–4.0)
MCH: 30.1 pg (ref 26.0–34.0)
MCHC: 34.5 g/dL (ref 30.0–36.0)
MCV: 87.3 fL (ref 78.0–100.0)
MONOS PCT: 8 %
Monocytes Absolute: 1.1 10*3/uL — ABNORMAL HIGH (ref 0.1–1.0)
NEUTROS ABS: 9.1 10*3/uL — AB (ref 1.7–7.7)
Neutrophils Relative %: 66 %
PLATELETS: 306 10*3/uL (ref 150–400)
RBC: 5.05 MIL/uL (ref 4.22–5.81)
RDW: 14.5 % (ref 11.5–15.5)
WBC: 13.8 10*3/uL — ABNORMAL HIGH (ref 4.0–10.5)

## 2015-04-21 LAB — I-STAT TROPONIN, ED: Troponin i, poc: 0 ng/mL (ref 0.00–0.08)

## 2015-04-21 MED ORDER — SODIUM CHLORIDE 0.9 % IV BOLUS (SEPSIS)
1000.0000 mL | Freq: Once | INTRAVENOUS | Status: AC
  Administered 2015-04-21: 1000 mL via INTRAVENOUS

## 2015-04-21 NOTE — ED Notes (Signed)
Per EMS- Patient lives in a group home. Patient states he stood to look out of a window and felt weak and everything went black. Patient stated he laid back down, but has been feeling weak for the past couple of days. Patient also states he has not been drinking enough water.

## 2015-04-21 NOTE — ED Provider Notes (Signed)
CSN: [id number]     Arrival date & time 04/21/15  1146 History   First MD Initiated Contact with Patient 04/21/15 [phone]     Chief Complaint  Patient presents with  . Near Syncope     (Consider location/radiation/quality/duration/timing/severity/associated sxs/prior Treatment) HPI Marcus Chang is a 35 y.o. male with history of schizophrenia, mental retardation, asthma, diabetes, presents to emergency department with complaint of near syncopal episode earlier today. Patient states at a group home. He states that he was laying down, and got up to go look out of the window, and suddenly felt like everything was turning black in his vision. He states he became dizzy and had to sit down. Symptoms quickly improved after he sat down. He did not lose consciousness completely. Patient denies any chest pain or shortness of breath during this episode. He denies any headache. He has no other associated symptoms. He states he has felt fine since this episode. He reports that he is diabetic, and he checked his blood sugar which was 90. He admits to not drinking enough fluids recently. He denies history of similar episodes in the past. He has no complaints at this time.  Past Medical History  Diagnosis Date  . Schizophrenia (HCC)   . Mental retardation   . Asthma   . Diabetes mellitus without complication (HCC)   . Spondylolysis    Past Surgical History  Procedure Laterality Date  . Wrist surgery    . Eye surgery     Family History  Problem Relation Age of Onset  . Family history unknown: Yes   Social History  Substance Use Topics  . Smoking status: Current Every Day Smoker -- 2.00 packs/day    Types: Cigarettes  . Smokeless tobacco: Never Used  . Alcohol Use: No    Review of Systems  Constitutional: Negative for fever and chills.  Respiratory: Negative for cough, chest tightness and shortness of breath.   Cardiovascular: Negative for chest pain, palpitations and leg swelling.   Gastrointestinal: Negative for nausea, vomiting, abdominal pain, diarrhea and abdominal distention.  Musculoskeletal: Negative for myalgias, arthralgias, neck pain and neck stiffness.  Skin: Negative for rash.  Allergic/Immunologic: Negative for immunocompromised state.  Neurological: Positive for dizziness and light-headedness. Negative for syncope, weakness, numbness and headaches.  All other systems reviewed and are negative.     Allergies  Review of patient's allergies indicates no known allergies.  Home Medications   Prior to Admission medications   Not on File   BP 114/63 mmHg  Pulse 109  Temp(Src) 97.8 F (36.6 C) (Oral)  SpO2 97% Physical Exam  Constitutional: He is oriented to person, place, and time. He appears well-developed and well-nourished. No distress.  HENT:  Head: Normocephalic and atraumatic.  Eyes: Conjunctivae are normal. Pupils are equal, round, and reactive to light.  Nystagmus present, non fatigable, bilateral (chronic)  Neck: Normal range of motion. Neck supple.  Cardiovascular: Normal rate, regular rhythm and normal heart sounds.   Pulmonary/Chest: Effort normal. No respiratory distress. He has no wheezes. He has no rales.  Abdominal: Soft. Bowel sounds are normal. He exhibits no distension. There is no tenderness. There is no rebound.  Musculoskeletal: He exhibits no edema.  Neurological: He is alert and oriented to person, place, and time. No cranial nerve deficit. Coordination normal.  5/5 and equal upper and lower extremity strength bilaterally. Equal grip strength bilaterally. Normal finger to nose and heel to shin. No pronator drift.   Skin: Skin is warm and dry.  Nursing note and vitals reviewed.   ED Course  Procedures (including critical care time) Labs Review Labs Reviewed  CBC WITH DIFFERENTIAL/PLATELET - Abnormal; Notable for the following:    WBC 13.8 (*)    Neutro Abs 9.1 (*)    Monocytes Absolute 1.1 (*)    All other  components within normal limits  BASIC METABOLIC PANEL - Abnormal; Notable for the following:    Chloride 97 (*)    All other components within normal limits  URINALYSIS, ROUTINE W REFLEX MICROSCOPIC (NOT AT [facility])  I-STAT TROPOININ, ED  CBG MONITORING, ED    Imaging Review No results found. I have personally reviewed and evaluated these images and lab results as part of my medical decision-making.   EKG Interpretation None      MDM   Final diagnoses:  Near syncope  Orthostatic dizziness    patient with near syncopal episode after standing up. No symptoms at present. No vertigo-like symptoms. Most likely orthostatic. Will check labs, EKG, orthostatic vitals.  Patient is mildly orthostatic with heart rate increasing upon standing. Hydrated with saline. He feels much better. Labs show a mildly elevated white blood cell count of 13.8. Otherwise unremarkable. Patient ate 2 sandwiches, states he feels much better, ambulated with no difficulties, ready for discharge home. Instructed to make sure to increase his by mouth intake and follow-up with primary care doctor. Advised to ability weight cell count, white count for fever, worsening symptoms. Follow up or return if worsening.  Filed Vitals:   04/21/15 1152 04/21/15 1156 04/21/15 1612 04/21/15 1855  BP: 114/63  110/73 121/85  Pulse: 109  99 91  Temp: 97.8 F (36.6 C)     TempSrc: Oral     Resp:   20 16  SpO2: 98% 97% 96% 97%     Jaynie Crumble, PA-C 04/21/15 2357  Derwood Kaplan, MD 04/22/15 1558

## 2015-04-21 NOTE — Discharge Instructions (Signed)
Continue to drink plenty of fluids. Follow up with primary care doctor or return if worsening.    Near-Syncope Near-syncope (commonly known as near fainting) is sudden weakness, dizziness, or feeling like you might pass out. During an episode of near-syncope, you may also develop pale skin, have tunnel vision, or feel sick to your stomach (nauseous). Near-syncope may occur when getting up after sitting or while standing for a long time. It is caused by a sudden decrease in blood flow to the brain. This decrease can result from various causes or triggers, most of which are not serious. However, because near-syncope can sometimes be a sign of something serious, a medical evaluation is required. The specific cause is often not determined. HOME CARE INSTRUCTIONS  Monitor your condition for any changes. The following actions may help to alleviate any discomfort you are experiencing:  Have someone stay with you until you feel stable.  Lie down right away and prop your feet up if you start feeling like you might faint. Breathe deeply and steadily. Wait until all the symptoms have passed. Most of these episodes last only a few minutes. You may feel tired for several hours.   Drink enough fluids to keep your urine clear or pale yellow.   If you are taking blood pressure or heart medicine, get up slowly when seated or lying down. Take several minutes to sit and then stand. This can reduce dizziness.  Follow up with your health care provider as directed. SEEK IMMEDIATE MEDICAL CARE IF:   You have a severe headache.   You have unusual pain in the chest, abdomen, or back.   You are bleeding from the mouth or rectum, or you have black or tarry stool.   You have an irregular or very fast heartbeat.   You have repeated fainting or have seizure-like jerking during an episode.   You faint when sitting or lying down.   You have confusion.   You have difficulty walking.   You have severe  weakness.   You have vision problems.  MAKE SURE YOU:   Understand these instructions.  Will watch your condition.  Will get help right away if you are not doing well or get worse.   This information is not intended to replace advice given to you by your health care provider. Make sure you discuss any questions you have with your health care provider.   Document Released: 02/11/2005 Document Revised: 02/16/2013 Document Reviewed: 07/17/2012 Elsevier Interactive Patient Education [employer]

## 2016-07-17 ENCOUNTER — Other Ambulatory Visit (INDEPENDENT_AMBULATORY_CARE_PROVIDER_SITE_OTHER): Payer: Self-pay

## 2016-07-17 ENCOUNTER — Ambulatory Visit (INDEPENDENT_AMBULATORY_CARE_PROVIDER_SITE_OTHER): Payer: Medicaid Other

## 2016-07-17 ENCOUNTER — Ambulatory Visit (INDEPENDENT_AMBULATORY_CARE_PROVIDER_SITE_OTHER): Payer: Medicaid Other | Admitting: Orthopaedic Surgery

## 2016-07-17 DIAGNOSIS — M5441 Lumbago with sciatica, right side: Secondary | ICD-10-CM

## 2016-07-17 DIAGNOSIS — G8929 Other chronic pain: Secondary | ICD-10-CM

## 2016-07-17 DIAGNOSIS — M5442 Lumbago with sciatica, left side: Principal | ICD-10-CM

## 2016-07-17 DIAGNOSIS — M4316 Spondylolisthesis, lumbar region: Secondary | ICD-10-CM | POA: Diagnosis not present

## 2016-07-17 MED ORDER — TIZANIDINE HCL 4 MG PO TABS: 4.0000 mg | ORAL_TABLET | Freq: Two times a day (BID) | ORAL | 0 refills | Status: DC | PRN

## 2016-07-17 MED ORDER — TRAMADOL HCL 50 MG PO TABS: 100.0000 mg | ORAL_TABLET | Freq: Four times a day (QID) | ORAL | 0 refills | Status: DC | PRN

## 2016-07-17 NOTE — Progress Notes (Signed)
The patient is well-known to me. 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 It has been grade 2 but his pain is slowly getting worse. He is getting numbness and tingling and weakness in both legs now this point.  On exam he has limited flexion-extension lumbar spine in extension causes significant pain. He has subjective weakness in both legs but good strength. There is numbness and tingling on L5 and S1 bilaterally.  2 views lumbar spine obtained today still show a grade 2 spondylolisthesis of his lumbar spine with bilateral pars defects.  I do feel that the spondylolisthesis is becoming much more symptomatic that he needs a referral to a spine specialist for the potential for surgery. His family agrees with this as well. I will try some Zanaflex and tramadol and the interim while we make that referral. All questions were encouraged and answered. We did go over his x-rays and show much spine model as well.

## 2016-07-29 ENCOUNTER — Other Ambulatory Visit (INDEPENDENT_AMBULATORY_CARE_PROVIDER_SITE_OTHER): Payer: Self-pay

## 2016-07-29 DIAGNOSIS — M5442 Lumbago with sciatica, left side: Secondary | ICD-10-CM

## 2016-07-31 ENCOUNTER — Telehealth (INDEPENDENT_AMBULATORY_CARE_PROVIDER_SITE_OTHER): Payer: Self-pay | Admitting: *Deleted

## 2016-07-31 NOTE — Telephone Encounter (Signed)
He still needs a referral to Dr. Noel Geroldohen once the MRI is done

## 2016-07-31 NOTE — Telephone Encounter (Signed)
Just spoke with pt mom Amil AmenJulia about scheduling follow up p MRI results and she wanted to know if Dr. Magnus IvanBlackman could call and discuss the results over phone, is having transportation problems and feels this may be easier. Pt is scheduled for MRI on 08/01/16.  C/B Amil AmenJulia [phone]

## 2016-08-01 ENCOUNTER — Other Ambulatory Visit: Payer: Medicaid Other

## 2016-08-12 ENCOUNTER — Ambulatory Visit
Admission: RE | Admit: 2016-08-12 | Discharge: 2016-08-12 | Disposition: A | Discharge status: Home / Self Care | Payer: Medicaid Other | Source: Ambulatory Visit | Attending: Orthopaedic Surgery | Admitting: Orthopaedic Surgery

## 2016-08-12 DIAGNOSIS — M5442 Lumbago with sciatica, left side: Secondary | ICD-10-CM

## 2016-08-13 ENCOUNTER — Other Ambulatory Visit (INDEPENDENT_AMBULATORY_CARE_PROVIDER_SITE_OTHER): Payer: Self-pay | Admitting: Orthopaedic Surgery

## 2016-08-13 NOTE — Telephone Encounter (Signed)
Do you want to refill? 

## 2016-08-14 NOTE — Telephone Encounter (Signed)
Pt had MRI 08/12/16 and referral is in system for referral to Dr. Noel Geroldohen. Pt's mother had asked if you would call with results.

## 2016-08-15 ENCOUNTER — Other Ambulatory Visit (INDEPENDENT_AMBULATORY_CARE_PROVIDER_SITE_OTHER): Payer: Self-pay | Admitting: Orthopaedic Surgery

## 2016-08-15 NOTE — Telephone Encounter (Signed)
Please advise 

## 2016-08-15 NOTE — Telephone Encounter (Signed)
Called into pharmacy

## 2016-08-26 ENCOUNTER — Other Ambulatory Visit (INDEPENDENT_AMBULATORY_CARE_PROVIDER_SITE_OTHER): Payer: Self-pay | Admitting: Orthopaedic Surgery

## 2016-08-26 NOTE — Telephone Encounter (Signed)
Called into pharmacy

## 2016-08-26 NOTE — Telephone Encounter (Signed)
Please advise 

## 2016-09-04 ENCOUNTER — Ambulatory Visit (INDEPENDENT_AMBULATORY_CARE_PROVIDER_SITE_OTHER): Payer: Medicaid Other | Admitting: Orthopaedic Surgery

## 2016-09-04 DIAGNOSIS — M4316 Spondylolisthesis, lumbar region: Secondary | ICD-10-CM | POA: Diagnosis not present

## 2016-09-04 NOTE — Progress Notes (Signed)
Mr. Marcus Chang is following up after MRI of his lumbar spine. He has known spondylolisthesis due to severe pars defect bilaterally at L5. He still having pain with extension of his back and radicular symptoms with pain numbness and tingling going down his right leg. He does have an appointment with the spine specialist at the end of August. He is taking tramadol occasionally for pain. He denies any worsening of symptoms but is not getting better either.  On exam hyperextension or extension even of his lumbar spine causes significant pain. There is numbness in the L5 distribution as well.  The MRI of his lumbar spine does confirm the bilateral pars defects as well as nerve impingement of the right L5 nerve root.  We gave her copies MRI report. If he needs more tramadol progress up on the with the specialist we can always refill this. He'll follow-up in this office as needed.

## 2016-09-16 ENCOUNTER — Other Ambulatory Visit (INDEPENDENT_AMBULATORY_CARE_PROVIDER_SITE_OTHER): Payer: Self-pay | Admitting: Orthopaedic Surgery

## 2016-09-17 NOTE — Telephone Encounter (Signed)
Please advise 

## 2016-09-17 NOTE — Telephone Encounter (Signed)
Called into pharmacy

## 2016-09-19 ENCOUNTER — Telehealth (INDEPENDENT_AMBULATORY_CARE_PROVIDER_SITE_OTHER): Payer: Self-pay | Admitting: Orthopaedic Surgery

## 2016-09-19 NOTE — Telephone Encounter (Signed)
[facility] RX faxed 09/18/16

## 2016-10-31 ENCOUNTER — Other Ambulatory Visit (INDEPENDENT_AMBULATORY_CARE_PROVIDER_SITE_OTHER): Payer: Self-pay | Admitting: Orthopaedic Surgery

## 2016-10-31 NOTE — Telephone Encounter (Signed)
Please advise 

## 2016-11-01 NOTE — Telephone Encounter (Signed)
[facility] with Rx.  

## 2017-04-13 ENCOUNTER — Emergency Department (HOSPITAL_COMMUNITY): Payer: Medicaid Other

## 2017-04-13 ENCOUNTER — Encounter (HOSPITAL_COMMUNITY): Payer: Self-pay

## 2017-04-13 ENCOUNTER — Inpatient Hospital Stay (HOSPITAL_COMMUNITY)
Admission: EM | Admit: 2017-04-13 | Discharge: 2017-04-15 | DRG: 871 | Disposition: A | Discharge status: Home / Self Care | Payer: Medicaid Other | Attending: Internal Medicine | Admitting: Internal Medicine

## 2017-04-13 DIAGNOSIS — R6889 Other general symptoms and signs: Secondary | ICD-10-CM

## 2017-04-13 DIAGNOSIS — G40909 Epilepsy, unspecified, not intractable, without status epilepticus: Secondary | ICD-10-CM | POA: Diagnosis present

## 2017-04-13 DIAGNOSIS — F79 Unspecified intellectual disabilities: Secondary | ICD-10-CM | POA: Diagnosis present

## 2017-04-13 DIAGNOSIS — Z716 Tobacco abuse counseling: Secondary | ICD-10-CM | POA: Diagnosis not present

## 2017-04-13 DIAGNOSIS — A419 Sepsis, unspecified organism: Principal | ICD-10-CM | POA: Diagnosis present

## 2017-04-13 DIAGNOSIS — N179 Acute kidney failure, unspecified: Secondary | ICD-10-CM | POA: Diagnosis present

## 2017-04-13 DIAGNOSIS — E119 Type 2 diabetes mellitus without complications: Secondary | ICD-10-CM | POA: Diagnosis present

## 2017-04-13 DIAGNOSIS — Z7984 Long term (current) use of oral hypoglycemic drugs: Secondary | ICD-10-CM

## 2017-04-13 DIAGNOSIS — J189 Pneumonia, unspecified organism: Secondary | ICD-10-CM | POA: Diagnosis present

## 2017-04-13 DIAGNOSIS — Z79899 Other long term (current) drug therapy: Secondary | ICD-10-CM

## 2017-04-13 DIAGNOSIS — Z79891 Long term (current) use of opiate analgesic: Secondary | ICD-10-CM | POA: Diagnosis not present

## 2017-04-13 DIAGNOSIS — J449 Chronic obstructive pulmonary disease, unspecified: Secondary | ICD-10-CM | POA: Diagnosis present

## 2017-04-13 DIAGNOSIS — F329 Major depressive disorder, single episode, unspecified: Secondary | ICD-10-CM | POA: Diagnosis present

## 2017-04-13 DIAGNOSIS — F209 Schizophrenia, unspecified: Secondary | ICD-10-CM | POA: Diagnosis present

## 2017-04-13 DIAGNOSIS — J9621 Acute and chronic respiratory failure with hypoxia: Secondary | ICD-10-CM | POA: Diagnosis present

## 2017-04-13 DIAGNOSIS — I10 Essential (primary) hypertension: Secondary | ICD-10-CM | POA: Diagnosis present

## 2017-04-13 DIAGNOSIS — F17210 Nicotine dependence, cigarettes, uncomplicated: Secondary | ICD-10-CM | POA: Diagnosis present

## 2017-04-13 DIAGNOSIS — J440 Chronic obstructive pulmonary disease with acute lower respiratory infection: Secondary | ICD-10-CM | POA: Diagnosis present

## 2017-04-13 DIAGNOSIS — E871 Hypo-osmolality and hyponatremia: Secondary | ICD-10-CM | POA: Diagnosis present

## 2017-04-13 DIAGNOSIS — Z7982 Long term (current) use of aspirin: Secondary | ICD-10-CM

## 2017-04-13 DIAGNOSIS — F32A Depression, unspecified: Secondary | ICD-10-CM | POA: Diagnosis present

## 2017-04-13 DIAGNOSIS — K219 Gastro-esophageal reflux disease without esophagitis: Secondary | ICD-10-CM | POA: Diagnosis present

## 2017-04-13 DIAGNOSIS — R0902 Hypoxemia: Secondary | ICD-10-CM

## 2017-04-13 HISTORY — DX: Essential (primary) hypertension: I10

## 2017-04-13 LAB — I-STAT CG4 LACTIC ACID, ED
Lactic Acid, Venous: 2.31 mmol/L (ref 0.5–1.9)
Lactic Acid, Venous: 1.28 mmol/L (ref 0.5–1.9)

## 2017-04-13 LAB — CBC
HCT: 35.8 % — ABNORMAL LOW (ref 39.0–52.0)
HEMATOCRIT: 40.2 % (ref 39.0–52.0)
HEMOGLOBIN: 11.9 g/dL — AB (ref 13.0–17.0)
Hemoglobin: 13.3 g/dL (ref 13.0–17.0)
MCH: 29 pg (ref 26.0–34.0)
MCH: 29 pg (ref 26.0–34.0)
MCHC: 33.1 g/dL (ref 30.0–36.0)
MCHC: 33.2 g/dL (ref 30.0–36.0)
MCV: 87.6 fL (ref 78.0–100.0)
MCV: 87.3 fL (ref 78.0–100.0)
PLATELETS: 245 10*3/uL (ref 150–400)
Platelets: 206 10*3/uL (ref 150–400)
RBC: 4.59 MIL/uL (ref 4.22–5.81)
RBC: 4.1 MIL/uL — ABNORMAL LOW (ref 4.22–5.81)
RDW: 15.3 % (ref 11.5–15.5)
RDW: 15.2 % (ref 11.5–15.5)
WBC: 18.6 10*3/uL — AB (ref 4.0–10.5)
WBC: 16.7 10*3/uL — ABNORMAL HIGH (ref 4.0–10.5)

## 2017-04-13 LAB — URINALYSIS, ROUTINE W REFLEX MICROSCOPIC
BILIRUBIN URINE: NEGATIVE
Glucose, UA: NEGATIVE mg/dL
HGB URINE DIPSTICK: NEGATIVE
Ketones, ur: NEGATIVE mg/dL
LEUKOCYTES UA: NEGATIVE
Nitrite: NEGATIVE
Protein, ur: 100 mg/dL — AB
SPECIFIC GRAVITY, URINE: 1.013 (ref 1.005–1.030)
pH: 5 (ref 5.0–8.0)

## 2017-04-13 LAB — BASIC METABOLIC PANEL
ANION GAP: 14 (ref 5–15)
BUN: 9 mg/dL (ref 6–20)
CHLORIDE: 94 mmol/L — AB (ref 101–111)
CO2: 24 mmol/L (ref 22–32)
CREATININE: 1.58 mg/dL — AB (ref 0.61–1.24)
Calcium: 8.9 mg/dL (ref 8.9–10.3)
GFR calc non Af Amer: 54 mL/min — ABNORMAL LOW (ref >60)
Glucose, Bld: 113 mg/dL — ABNORMAL HIGH (ref 65–99)
POTASSIUM: 4.3 mmol/L (ref 3.5–5.1)
SODIUM: 132 mmol/L — AB (ref 135–145)

## 2017-04-13 LAB — INFLUENZA PANEL BY PCR (TYPE A & B)
INFLAPCR: NEGATIVE
INFLBPCR: NEGATIVE

## 2017-04-13 LAB — CREATININE, SERUM
CREATININE: 1.21 mg/dL (ref 0.61–1.24)
GFR calc Af Amer: >60 mL/min (ref >60)
GFR calc non Af Amer: >60 mL/min (ref >60)

## 2017-04-13 LAB — CBG MONITORING, ED: GLUCOSE-CAPILLARY: 103 mg/dL — AB (ref 65–99)

## 2017-04-13 MED ORDER — ASPIRIN EC 81 MG PO TBEC
81.0000 mg | DELAYED_RELEASE_TABLET | Freq: Every day | ORAL | Status: DC
  Administered 2017-04-14 – 2017-04-15 (×2): 81 mg via ORAL
  Filled 2017-04-13 (×2): qty 1

## 2017-04-13 MED ORDER — ACETAMINOPHEN 325 MG PO TABS
650.0000 mg | ORAL_TABLET | Freq: Four times a day (QID) | ORAL | Status: DC | PRN
  Administered 2017-04-13: 650 mg via ORAL
  Filled 2017-04-13: qty 2

## 2017-04-13 MED ORDER — VANCOMYCIN HCL IN DEXTROSE 1-5 GM/200ML-% IV SOLN: 1000.0000 mg | Freq: Once | INTRAVENOUS | Status: DC

## 2017-04-13 MED ORDER — SODIUM CHLORIDE 0.9 % IV SOLN
1250.0000 mg | Freq: Two times a day (BID) | INTRAVENOUS | Status: DC
  Administered 2017-04-14: 1250 mg via INTRAVENOUS
  Filled 2017-04-13 (×2): qty 1250

## 2017-04-13 MED ORDER — SODIUM CHLORIDE 0.9 % IV BOLUS (SEPSIS)
1000.0000 mL | Freq: Once | INTRAVENOUS | Status: AC
  Administered 2017-04-13: 1000 mL via INTRAVENOUS

## 2017-04-13 MED ORDER — ZOLPIDEM TARTRATE 5 MG PO TABS
10.0000 mg | ORAL_TABLET | Freq: Every evening | ORAL | Status: DC | PRN
  Filled 2017-04-13: qty 2

## 2017-04-13 MED ORDER — ENOXAPARIN SODIUM 40 MG/0.4ML ~~LOC~~ SOLN
40.0000 mg | SUBCUTANEOUS | Status: DC
  Administered 2017-04-13 – 2017-04-14 (×2): 40 mg via SUBCUTANEOUS
  Filled 2017-04-13 (×3): qty 0.4

## 2017-04-13 MED ORDER — DIVALPROEX SODIUM ER 250 MG PO TB24
250.0000 mg | ORAL_TABLET | Freq: Every day | ORAL | Status: DC
  Administered 2017-04-13 – 2017-04-14 (×2): 250 mg via ORAL
  Filled 2017-04-13 (×5): qty 1

## 2017-04-13 MED ORDER — IBUPROFEN 600 MG PO TABS: 600.0000 mg | ORAL_TABLET | Freq: Four times a day (QID) | ORAL | Status: DC | PRN

## 2017-04-13 MED ORDER — ALBUTEROL SULFATE (2.5 MG/3ML) 0.083% IN NEBU: 2.5000 mg | INHALATION_SOLUTION | RESPIRATORY_TRACT | Status: DC | PRN

## 2017-04-13 MED ORDER — FLUOXETINE HCL 10 MG PO CAPS
10.0000 mg | ORAL_CAPSULE | Freq: Every day | ORAL | Status: DC
  Administered 2017-04-14 – 2017-04-15 (×2): 10 mg via ORAL
  Filled 2017-04-13 (×3): qty 1

## 2017-04-13 MED ORDER — ENOXAPARIN SODIUM 40 MG/0.4ML ~~LOC~~ SOLN: 40.0000 mg | SUBCUTANEOUS | Status: DC

## 2017-04-13 MED ORDER — INSULIN ASPART 100 UNIT/ML ~~LOC~~ SOLN
0.0000 [IU] | Freq: Three times a day (TID) | SUBCUTANEOUS | Status: DC
Start: 2017-04-14 — End: 2017-04-15

## 2017-04-13 MED ORDER — IPRATROPIUM-ALBUTEROL 0.5-2.5 (3) MG/3ML IN SOLN
3.0000 mL | Freq: Four times a day (QID) | RESPIRATORY_TRACT | Status: DC
  Administered 2017-04-13 – 2017-04-14 (×5): 3 mL via RESPIRATORY_TRACT
  Filled 2017-04-13 (×5): qty 3

## 2017-04-13 MED ORDER — PIPERACILLIN-TAZOBACTAM 3.375 G IVPB 30 MIN
3.3750 g | Freq: Once | INTRAVENOUS | Status: AC
  Administered 2017-04-13: 3.375 g via INTRAVENOUS
  Filled 2017-04-13: qty 50

## 2017-04-13 MED ORDER — TRIHEXYPHENIDYL HCL 2 MG PO TABS
2.0000 mg | ORAL_TABLET | Freq: Every day | ORAL | Status: DC
  Administered 2017-04-14 – 2017-04-15 (×2): 2 mg via ORAL
  Filled 2017-04-13 (×3): qty 1

## 2017-04-13 MED ORDER — ONDANSETRON HCL 4 MG PO TABS
4.0000 mg | ORAL_TABLET | Freq: Four times a day (QID) | ORAL | Status: DC | PRN
Start: 2017-04-13 — End: 2017-04-15

## 2017-04-13 MED ORDER — VANCOMYCIN HCL 10 G IV SOLR
1750.0000 mg | Freq: Once | INTRAVENOUS | Status: AC
  Administered 2017-04-13: 1750 mg via INTRAVENOUS
  Filled 2017-04-13: qty 1750

## 2017-04-13 MED ORDER — CETIRIZINE HCL 10 MG PO TABS
5.0000 mg | ORAL_TABLET | Freq: Every day | ORAL | Status: DC | PRN
  Administered 2017-04-14: 5 mg via ORAL
  Filled 2017-04-13 (×3): qty 1

## 2017-04-13 MED ORDER — NICOTINE 21 MG/24HR TD PT24: 21.0000 mg | MEDICATED_PATCH | Freq: Every day | TRANSDERMAL | Status: DC

## 2017-04-13 MED ORDER — PANTOPRAZOLE SODIUM 40 MG PO TBEC
40.0000 mg | DELAYED_RELEASE_TABLET | Freq: Every day | ORAL | Status: DC
  Administered 2017-04-14 – 2017-04-15 (×2): 40 mg via ORAL
  Filled 2017-04-13 (×2): qty 1

## 2017-04-13 MED ORDER — ATORVASTATIN CALCIUM 40 MG PO TABS
40.0000 mg | ORAL_TABLET | Freq: Every day | ORAL | Status: DC
  Administered 2017-04-14: 40 mg via ORAL
  Filled 2017-04-13 (×2): qty 1

## 2017-04-13 MED ORDER — OSELTAMIVIR PHOSPHATE 75 MG PO CAPS
75.0000 mg | ORAL_CAPSULE | Freq: Once | ORAL | Status: AC
  Administered 2017-04-13: 75 mg via ORAL
  Filled 2017-04-13: qty 1

## 2017-04-13 MED ORDER — LISINOPRIL 20 MG PO TABS: 20.0000 mg | ORAL_TABLET | Freq: Every day | ORAL | Status: DC

## 2017-04-13 MED ORDER — CHLORPROMAZINE HCL 50 MG PO TABS
50.0000 mg | ORAL_TABLET | Freq: Two times a day (BID) | ORAL | Status: DC
  Administered 2017-04-13 – 2017-04-15 (×4): 50 mg via ORAL
  Filled 2017-04-13 (×2): qty 1
  Filled 2017-04-13: qty 2
  Filled 2017-04-13 (×3): qty 1

## 2017-04-13 MED ORDER — PIPERACILLIN-TAZOBACTAM 3.375 G IVPB
3.3750 g | Freq: Three times a day (TID) | INTRAVENOUS | Status: DC
  Administered 2017-04-13: 3.375 g via INTRAVENOUS
  Filled 2017-04-13: qty 50

## 2017-04-13 MED ORDER — RISPERIDONE 2 MG PO TABS
4.0000 mg | ORAL_TABLET | Freq: Two times a day (BID) | ORAL | Status: DC
  Administered 2017-04-13 – 2017-04-15 (×4): 4 mg via ORAL
  Filled 2017-04-13 (×7): qty 2

## 2017-04-13 MED ORDER — HYDROXYZINE PAMOATE 50 MG PO CAPS
50.0000 mg | ORAL_CAPSULE | Freq: Every day | ORAL | Status: DC
  Administered 2017-04-13 – 2017-04-14 (×2): 50 mg via ORAL
  Filled 2017-04-13 (×4): qty 1

## 2017-04-13 MED ORDER — SODIUM CHLORIDE 0.9 % IV SOLN
INTRAVENOUS | Status: AC
  Administered 2017-04-13 – 2017-04-14 (×2): via INTRAVENOUS

## 2017-04-13 MED ORDER — ACETAMINOPHEN 650 MG RE SUPP: 650.0000 mg | Freq: Four times a day (QID) | RECTAL | Status: DC | PRN

## 2017-04-13 MED ORDER — ONDANSETRON HCL 4 MG/2ML IJ SOLN: 4.0000 mg | Freq: Four times a day (QID) | INTRAMUSCULAR | Status: DC | PRN

## 2017-04-13 MED ORDER — NICOTINE 21 MG/24HR TD PT24
21.0000 mg | MEDICATED_PATCH | Freq: Every day | TRANSDERMAL | Status: DC
  Administered 2017-04-13 – 2017-04-15 (×3): 21 mg via TRANSDERMAL
  Filled 2017-04-13 (×3): qty 1

## 2017-04-13 MED ORDER — GABAPENTIN 300 MG PO CAPS
300.0000 mg | ORAL_CAPSULE | Freq: Two times a day (BID) | ORAL | Status: DC
  Administered 2017-04-13 – 2017-04-15 (×4): 300 mg via ORAL
  Filled 2017-04-13 (×4): qty 1

## 2017-04-13 MED ORDER — IOPAMIDOL (ISOVUE-370) INJECTION 76%
INTRAVENOUS | Status: AC
  Administered 2017-04-13: 100 mL via INTRAVENOUS
  Filled 2017-04-13: qty 100

## 2017-04-13 NOTE — ED Notes (Signed)
Pt placed in hospital bed for comfort.

## 2017-04-13 NOTE — ED Notes (Signed)
I Stat Lactic Acid results shown to Kathreen CosierKoula Pate RN

## 2017-04-13 NOTE — Progress Notes (Signed)
Pharmacy Antibiotic Note  Marcus Chang is a 37 y.o. male admitted on 04/13/2017 with sepsis.  Pharmacy has been consulted for Vancomycin and Zosyn dosing.  WBC is elevated, 18.6. LA is 2.31. Tm 100.  Tachycardic. Estimated weight 90 kg.   Plan: Zosyn 3.375g IV every 8 hours -4 hr infusion Vancomycin 1750mg  IV x1 now, then 1250mg  IV every 12 hours.  Monitor renal function, clinical status, and culture results.      Temp (24hrs), Avg:99.5 F (37.5 C), Min:98.9 F (37.2 C), Max:100 F (37.8 C)  Recent Labs  Lab 04/13/17 1259 04/13/17 1315  WBC 18.6*  --   CREATININE 1.58*  --   LATICACIDVEN  --  2.31*    CrCl cannot be calculated (Unknown ideal weight.).    No Known Allergies  Antimicrobials this admission: 2/17 Vancomycin >> 2/17 Zosyn >>  Dose adjustments this admission:   Microbiology results: 2/17 Blood >>  Thank you for allowing pharmacy to be a part of this patient's care.  Link SnufferJessica Jaydeen Darley, PharmD, BCPS, BCCCP Clinical Pharmacist Clinical phone 04/13/2017 until 3:30PM [phone]- [phone] After hours, please call [phone] 04/13/2017 3:02 PM

## 2017-04-13 NOTE — H&P (Signed)
History and Physical:    Marcus Chang   ZOX:[id number] DOB: 1980-06-30 DOA: 04/13/2017  Referring MD/provider: Fayrene Helper PCP: Fleet Contras, MD   Patient coming from: Home  Chief Complaint: Fevers and cough  History of Present Illness:   Marcus Chang is an 37 y.o. male with past medical history significant for schizophrenia who lives at home with his mom, diabetes, COPD with ongoing tobacco use who was in his usual state of reasonable health until 2 days prior to admission when he noted onset of cough associated with some URI symptoms. He subsequently noted fevers chills and sweats. He declined to come into be evaluated until this morning when he felt very weak, dizzy and tired. He had few episodes of presyncope anytime he tried to get up.  Patient notes the cough is nonproductive. He does admit to ongoing tobacco use but states that he does not normally have dyspnea on exertion. He does have intermittent use of his inhaler which he has been using more of lately without good effect.  No abdominal pain, diarrhea, nausea, vomiting. No headache or neck stiffness. No photophobia.  ED Course:  The patient was noted to meet sepsis criteria. He was started on broad-spectrum antibiotics with vancomycin and Zosyn and given aggressive fluid resuscitation.  ROS:   ROS   As per history of present illness  Past Medical History:   Past Medical History:  Diagnosis Date  . Asthma   . Diabetes mellitus without complication (HCC)   . Mental retardation   . Schizophrenia (HCC)   . Spondylolysis     Past Surgical History:   Past Surgical History:  Procedure Laterality Date  . EYE SURGERY    . WRIST SURGERY      Social History:   Social History   Socioeconomic History  . Marital status: Single    Spouse name: Not on file  . Number of children: Not on file  . Years of education: Not on file  . Highest education level: Not on file  Social Needs  . Financial resource  strain: Not on file  . Food insecurity - worry: Not on file  . Food insecurity - inability: Not on file  . Transportation needs - medical: Not on file  . Transportation needs - non-medical: Not on file  Occupational History  . Not on file  Tobacco Use  . Smoking status: Current Every Day Smoker    Packs/day: 2.00    Types: Cigarettes  . Smokeless tobacco: Never Used  Substance and Sexual Activity  . Alcohol use: No  . Drug use: No  . Sexual activity: Not on file  Other Topics Concern  . Not on file  Social History Narrative  . Not on file    Allergies   Patient has no known allergies.  Family history:   Family History  Family history unknown: Yes    Current Medications:   Prior to Admission medications   Medication Sig Start Date End Date Taking? Authorizing Provider  albuterol (PROVENTIL HFA;VENTOLIN HFA) 108 (90 Base) MCG/ACT inhaler Inhale 2 puffs into the lungs 4 (four) times daily as needed for wheezing or shortness of breath.    Yes [provider]  aspirin EC 81 MG tablet Take 81 mg by mouth daily.   Yes [provider]  chlorproMAZINE (THORAZINE) 25 MG tablet Take 50 mg by mouth 2 (two) times daily.   Yes [provider]  divalproex (DEPAKOTE ER) 250 MG 24 hr tablet Take  250 mg by mouth at bedtime.   Yes [provider]  FLUoxetine (PROZAC) 10 MG capsule Take 10 mg by mouth daily after breakfast.    Yes [provider]  gabapentin (NEURONTIN) 300 MG capsule Take 300 mg by mouth See admin instructions. Take one capsule (300 mg) by mouth twice daily, may also take one capsule (300 mg) at 2pm as needed for back pain   Yes [provider]  hydrOXYzine (VISTARIL) 50 MG capsule Take 50 mg by mouth at bedtime.   Yes [provider]  levocetirizine (XYZAL) 5 MG tablet Take 5 mg by mouth daily as needed for allergies.   Yes [provider]  lisinopril (PRINIVIL,ZESTRIL) 20 MG tablet Take 20 mg by mouth  daily.   Yes [provider]  metFORMIN (GLUCOPHAGE) 500 MG tablet Take 500 mg by mouth 2 (two) times daily with a meal.   Yes [provider]  Multiple Vitamin (MULTIVITAMIN WITH MINERALS) TABS tablet Take 1 tablet by mouth daily.   Yes [provider]  omeprazole (PRILOSEC) 20 MG capsule Take 20 mg by mouth daily.   Yes [provider]  risperidone (RISPERDAL) 4 MG tablet Take 4 mg by mouth 2 (two) times daily.   Yes [provider]  simvastatin (ZOCOR) 80 MG tablet Take 80 mg by mouth at bedtime.   Yes [provider]  trihexyphenidyl (ARTANE) 2 MG tablet Take 2 mg by mouth daily.   Yes [provider]  zolpidem (AMBIEN) 10 MG tablet Take 10 mg by mouth at bedtime as needed for sleep.   Yes [provider]  tiZANidine (ZANAFLEX) 4 MG tablet TAKE 1 TABLET (4 MG TOTAL) BY MOUTH TWO (TWO) TIMES DAILY AS NEEDED FOR MUSCLE SPASMS. Patient not taking: Reported on 04/13/2017 08/26/16   Kathryne HitchBlackman, Christopher Y, MD  traMADol (ULTRAM) 50 MG tablet TAKE 1 TO TWO TABLETS BY MOUTH EVERY SIX HOURS AS NEEDED Patient not taking: Reported on 04/13/2017 10/31/16   Kathryne HitchBlackman, Christopher Y, MD    Physical Exam:   Vitals:   04/13/17 1630 04/13/17 1645 04/13/17 1700 04/13/17 1715  BP: (!) 85/54 (!) 95/53 93/62 110/66  Pulse: (!) 106 (!) 103 (!) 103 (!) 104  Resp: (!) 32 (!) 26 (!) 32 (!) 28  Temp:      TempSrc:      SpO2: 93% 92% 91% 95%     Physical Exam: Blood pressure 110/66, pulse (!) 104, temperature (!) 101.2 F (38.4 C), temperature source Rectal, resp. rate (!) 28, SpO2 95 %. Gen: Tired-appearing man with flushed faces lying in bed with attentive mother at bedside. Eyes: Sclerae anicteric. Conjunctiva mildly injected. Neck: Supple, no jugular venous distention. Chest: Moderately good air entry bilaterally, no wheezes noted however I did hear a few crackles at his right base. CV: Distant, regular, no audible murmurs. Abdomen:  NABS, soft, nondistended, nontender. No tenderness to light or deep palpation. No rebound, no guarding. Extremities: No edema.  Skin: Flushed, Warm and dry. No rashes, lesions or wounds. Neuro: Alert and oriented times 3; grossly nonfocal. Psych: Patient is cooperative, does have a childish affect.  Data Review:    Labs: Basic Metabolic Panel: Recent Labs  Lab 04/13/17 1259  NA 132*  K 4.3  CL 94*  CO2 24  GLUCOSE 113*  BUN 9  CREATININE 1.58*  CALCIUM 8.9   Liver Function Tests: No results for input(s): AST, ALT, ALKPHOS, BILITOT, PROT, ALBUMIN in the last 168 hours. No results  for input(s): LIPASE, AMYLASE in the last 168 hours. No results for input(s): AMMONIA in the last 168 hours. CBC: Recent Labs  Lab 04/13/17 1259  WBC 18.6*  HGB 13.3  HCT 40.2  MCV 87.6  PLT 245   Cardiac Enzymes: No results for input(s): CKTOTAL, CKMB, CKMBINDEX, TROPONINI in the last 168 hours.  BNP (last 3 results) No results for input(s): PROBNP in the last 8760 hours. CBG: Recent Labs  Lab 04/13/17 1304  GLUCAP 103*    Urinalysis    Component Value Date/Time   COLORURINE AMBER (A) 04/13/2017 1319   APPEARANCEUR CLOUDY (A) 04/13/2017 1319   LABSPEC 1.013 04/13/2017 1319   PHURINE 5.0 04/13/2017 1319   GLUCOSEU NEGATIVE 04/13/2017 1319   HGBUR NEGATIVE 04/13/2017 1319   BILIRUBINUR NEGATIVE 04/13/2017 1319   KETONESUR NEGATIVE 04/13/2017 1319   PROTEINUR 100 (A) 04/13/2017 1319   UROBILINOGEN 0.2 06/05/2008 2053   NITRITE NEGATIVE 04/13/2017 1319   LEUKOCYTESUR NEGATIVE 04/13/2017 1319      Radiographic Studies: Dg Chest 2 View  Result Date: 04/13/2017 CLINICAL DATA:  Cough.  Sepsis. EXAM: CHEST  2 VIEW COMPARISON:  Jul 15, 2008 FINDINGS: The heart is borderline to mildly enlarged. The hila and mediastinum are unchanged. No pneumothorax. No nodules or masses. No focal infiltrates. IMPRESSION: No active cardiopulmonary disease. Electronically Signed   By: Gerome Sam III M.D   On: 04/13/2017 15:51    EKG: Independently reviewed. Sinus rhythm at 100. Insignificant Q in 3 and F. Prominent S wave in V2 with increased voltage and that lead. No acute ST-T wave changes.   Assessment/Plan:   Principal Problem:   Hypoxia Active Problems:   Type 2 diabetes mellitus without complication (HCC)   Schizophrenia (HCC)   COPD (chronic obstructive pulmonary disease) (HCC)   Depression   Seizure disorder (HCC)  HYPOXIA Patient with new option requirement. He has known COPD and therefore likely has decreased pulmonary reserve. Signs and symptoms are certainly suggestive of the FLU and he may well have a viral pneumonitis. I also hear rales at the right base and he may have a bacterial pneumonia as well. CT is ordered and pending. Continue oxygen. Will provide duo nebs but will not start prednisone right now given lack of wheezing and possible bacterial process.  SEPSIS Patient meets criteria for sepsis so therefore was started on broad-spectrum antibiotics with Zosyn and vancomycin however we have no clear source as chest x-rays, urine and abdominal exam are negative. My physical exam does reveal crackles at the right base so he may be developing an impending pneumonia. CT is pending. If negative would consider repeating chest x-ray before discontinuing antibiotics.  HYPONATREMIA Likely secondary to intravascular fluid depletion, will continue aggressive fluid resuscitation with normal saline.  AKI Should improve with treatment of infection and fluid resuscitation.  FLU  flu swab is pending however patient's symptoms are strongly suggestive of influenza. Continue Tamiflu  LBP I do not want to initiate narcotics on this young man with lower back pain with a propensity for addiction. This with his mother who was somewhat resistant to this. Will increase gabapentin to 600 mg at bedtime continue 300 mg a.m. and with lunch.  DM Hold metformin, sliding  scale insulin before meals at bedtime Will need diabetic education given patient's proclivity for drinking mellow yellows  HTN Hold lisinopril given acute kidney injury likely secondary to intravascular fluid depletion. Would restart when BP rebounds and renal function has returned to normal.  COPD  Duo nebs every 6 hours standing Albuterol every 2 when necessary Will not initiate prednisone right now given no wheezing or other signs of acute exacerbation.  SCHIZOPHRENIA Seems well controlled without any psychotic features on my examination. Continue Thorazine, risperidone and Artane per home doses. Continue fluoxetine  GERD  Continue pantoprazole    Other information:   DVT prophylaxis: Lovenox ordered. Code Status: Full code. Family Communication:  mother was at bedside throughout  Disposition Plan: Home Consults called: None  Admission status: Inpatient   The medical decision making on this patient was of high complexity and the patient is at high risk for clinical deterioration, therefore this is a level 3 visit.   Horatio Pel Orma Flaming [facility] Pager [phone] Cell: [phone]   If 7PM-7AM, please contact night-coverage [email or web address] Password [facility] 04/13/2017, 5:59 PM

## 2017-04-13 NOTE — ED Notes (Signed)
Carb modified dinner tray ordered 

## 2017-04-13 NOTE — ED Notes (Signed)
Per RN, I-stat lactic was just done on pt, redraw I stat lactic in another hr after fluids.

## 2017-04-13 NOTE — ED Provider Notes (Signed)
[facility]   CSN: [id number] Arrival date & time: 04/13/17  1249     History   Chief Complaint Chief Complaint  Patient presents with  . cough/congestion/near syncope    HPI Marcus Chang is a 37 y.o. male.  HPI   37 year old male with history of diabetes, asthma, schizophrenia, mental handicap brought in accompanied by family member for evaluation of cough.for the past 3 days patient has had a productive cough, chills,headache, generalized weakness, lightheadedness, nearly passing out several times, decreased appetite, and having body aches. Symptom is been persistent. No specific treatment tried. He has had his flu shot this year. He denies having nausea, vomiting, diarrhea, trouble urinating, or rash. He is normally healthy. He lives at home with his mom. No prior history of PE or DVT, no recent surgery, prolonged bed rest, or recent travel. no recent change in medication.  Past Medical History:  Diagnosis Date  . Asthma   . Diabetes mellitus without complication (HCC)   . Mental retardation   . Schizophrenia (HCC)   . Spondylolysis     Patient Active Problem List   Diagnosis Date Noted  . Spondylolisthesis, lumbar region 07/17/2016    Past Surgical History:  Procedure Laterality Date  . EYE SURGERY    . WRIST SURGERY         Home Medications    Prior to Admission medications   Medication Sig Start Date End Date Taking? Authorizing Provider  acetaminophen (TYLENOL) 500 MG tablet Take 1,000 mg by mouth every 6 (six) hours as needed for mild pain, moderate pain or headache. Reported on 04/21/2015    [provider]  albuterol (PROVENTIL HFA;VENTOLIN HFA) 108 (90 Base) MCG/ACT inhaler Inhale 2 puffs into the lungs every 6 (six) hours as needed for wheezing or shortness of breath.    [provider]  aspirin 81 MG tablet Take 81 mg by mouth daily.    [provider]  chlorproMAZINE  (THORAZINE) 25 MG tablet Take 50 mg by mouth 2 (two) times daily.    [provider]  chlorproPAMIDE (DIABINESE) 100 MG tablet Take 100 mg by mouth at bedtime.    [provider]  clotrimazole-betamethasone (LOTRISONE) cream Apply 1 application topically 2 (two) times daily as needed (rashes).    [provider]  dextromethorphan (DELSYM) 30 MG/5ML liquid Take 60 mg by mouth as needed for cough.    [provider]  diclofenac (CATAFLAM) 50 MG tablet Take 50 mg by mouth 2 (two) times daily as needed (pain).    [provider]  divalproex (DEPAKOTE ER) 250 MG 24 hr tablet Take 250 mg by mouth at bedtime.    [provider]  FLUoxetine (PROZAC) 10 MG capsule Take 10 mg by mouth daily.    [provider]  hydrOXYzine (ATARAX/VISTARIL) 50 MG tablet Take 50 mg by mouth at bedtime.    [provider]  lisinopril (PRINIVIL,ZESTRIL) 10 MG tablet Take 10 mg by mouth daily.    [provider]  LORazepam (ATIVAN) 0.5 MG tablet Take 0.5 mg by mouth at bedtime as needed for sleep.    [provider]  meloxicam (MOBIC) 7.5 MG tablet Take 7.5 mg by mouth 2 (two) times daily.    [provider]  metFORMIN (GLUCOPHAGE) 500 MG tablet Take 500 mg by mouth 2 (two) times daily with a meal.    [provider]  omeprazole (PRILOSEC) 20 MG capsule Take 20 mg  by mouth daily.    [provider]  risperiDONE (RISPERDAL) 3 MG tablet Take 3 mg by mouth 2 (two) times daily.    [provider]  risperiDONE microspheres (RISPERDAL CONSTA) 50 MG injection Inject 50 mg into the muscle every 14 (fourteen) days.    [provider]  tiZANidine (ZANAFLEX) 4 MG tablet TAKE 1 TABLET (4 MG TOTAL) BY MOUTH TWO (TWO) TIMES DAILY AS NEEDED FOR MUSCLE SPASMS. 08/26/16   Kathryne Hitch, MD  traMADol (ULTRAM) 50 MG tablet TAKE 1 TO TWO TABLETS BY MOUTH EVERY SIX HOURS AS NEEDED 10/31/16   Kathryne Hitch, MD  zolpidem (AMBIEN) 5 MG tablet Take 5 mg by mouth at bedtime as needed for sleep.    [provider]    Family History Family History  Family history unknown: Yes    Social History Social History   Tobacco Use  . Smoking status: Current Every Day Smoker    Packs/day: 2.00    Types: Cigarettes  . Smokeless tobacco: Never Used  Substance Use Topics  . Alcohol use: No  . Drug use: No     Allergies   Patient has no known allergies.   Review of Systems Review of Systems  All other systems reviewed and are negative.    Physical Exam Updated Vital Signs BP (!) 101/41   Pulse (!) 125   Temp 98.9 F (37.2 C) (Oral)   Resp 20   SpO2 95%   Physical Exam  Constitutional: He is oriented to person, place, and time. He appears well-developed and well-nourished. No distress.  Nontoxic in appearance  HENT:  Head: Atraumatic.  Right Ear: External ear normal.  Left Ear: External ear normal.  Mouth/Throat: Oropharynx is clear and moist.  Eyes: Conjunctivae and EOM are normal. Pupils are equal, round, and reactive to light.  Neck: Normal range of motion. Neck supple.  No nuchal rigidity  Cardiovascular:  Tachycardia without murmurs rubs or gallops  Pulmonary/Chest: Effort normal. No stridor. He has wheezes (Faint wheezes). He has no rales.  Abdominal: Soft. He exhibits no distension. There is no tenderness.  Musculoskeletal: He exhibits no edema.  Neurological: He is alert and oriented to person, place, and time.  Skin: Skin is warm. No rash noted.  Psychiatric: He has a normal mood and affect.  Nursing note and vitals reviewed.    ED Treatments / Results  Labs (all labs ordered are listed, but only abnormal results are displayed) Labs Reviewed  BASIC METABOLIC PANEL - Abnormal; Notable for the following components:      Result Value   Sodium 132 (*)    Chloride 94 (*)    Glucose, Bld 113 (*)    Creatinine, Ser 1.58 (*)    GFR calc non Af Amer 54  (*)    All other components within normal limits  CBC - Abnormal; Notable for the following components:   WBC 18.6 (*)    All other components within normal limits  URINALYSIS, ROUTINE W REFLEX MICROSCOPIC - Abnormal; Notable for the following components:   Color, Urine AMBER (*)    APPearance CLOUDY (*)    Protein, ur 100 (*)    Bacteria, UA RARE (*)    Squamous Epithelial / LPF 0-5 (*)    All other components within normal limits  CBG MONITORING, ED - Abnormal; Notable for the following components:   Glucose-Capillary 103 (*)    All other components within normal limits  I-STAT CG4 LACTIC  ACID, ED - Abnormal; Notable for the following components:   Lactic Acid, Venous 2.31 (*)    All other components within normal limits  CULTURE, BLOOD (ROUTINE X 2)  CULTURE, BLOOD (ROUTINE X 2)  INFLUENZA PANEL BY PCR (TYPE A & B)  I-STAT CG4 LACTIC ACID, ED    EKG  EKG Interpretation None       Radiology Dg Chest 2 View  Result Date: 04/13/2017 CLINICAL DATA:  Cough.  Sepsis. EXAM: CHEST  2 VIEW COMPARISON:  Jul 15, 2008 FINDINGS: The heart is borderline to mildly enlarged. The hila and mediastinum are unchanged. No pneumothorax. No nodules or masses. No focal infiltrates. IMPRESSION: No active cardiopulmonary disease. Electronically Signed   By: Gerome Sam III M.D   On: 04/13/2017 15:51    Procedures Procedures (including critical care time)  Medications Ordered in ED Medications  sodium chloride 0.9 % bolus 1,000 mL (1,000 mLs Intravenous New Bag/Given 04/13/17 1508)    And  sodium chloride 0.9 % bolus 1,000 mL (not administered)    And  sodium chloride 0.9 % bolus 1,000 mL (not administered)  vancomycin (VANCOCIN) 1,750 mg in sodium chloride 0.9 % 500 mL IVPB (1,750 mg Intravenous New Bag/Given 04/13/17 1545)  piperacillin-tazobactam (ZOSYN) IVPB 3.375 g (not administered)  vancomycin (VANCOCIN) 1,250 mg in sodium chloride 0.9 % 250 mL IVPB (not administered)  oseltamivir  (TAMIFLU) capsule 75 mg (not administered)  piperacillin-tazobactam (ZOSYN) IVPB 3.375 g (0 g Intravenous Stopped 04/13/17 1545)     Initial Impression / Assessment and Plan / ED Course  I have reviewed the triage vital signs and the nursing notes.  Pertinent labs & imaging results that were available during my care of the patient were reviewed by me and considered in my medical decision making (see chart for details).     BP (!) 86/56 (BP Location: Right Arm)   Pulse (!) 125   Temp 100 F (37.8 C) (Oral)   Resp (!) 26   SpO2 (!) 88%    Final Clinical Impressions(s) / ED Diagnoses   Final diagnoses:  Sepsis, due to unspecified organism [facility])  Flu-like symptoms    ED Discharge Orders    None     3:03 PM Patient since with cough. He meets sepsis criteria. He is tachycardic, hypotensive, and febrile. He is also hypoxic with O2 at 88% on room air. Improves with supplements oxygenation.labs remarkable for an elevated lactic acid of 2.31, and a leukocytosis with WBC 18.6. Will obtain chest x-ray and will swab for flu. patient will be treated with broad-spectrum antibiotic at this time. Will fluid resuscitate at 30mg /kg.    Sepsis - Repeat Assessment  Performed at:    1500  Vitals     Blood pressure (!) 94/51, pulse (!) 114, temperature (!) 101.2 F (38.4 C), temperature source Rectal, resp. rate (!) 30, SpO2 91 %.  Heart:     Tachycardic  Lungs:    CTA  Capillary Refill:   <2 sec  Peripheral Pulse:   Radial pulse palpable  Skin:     Normal Color     4:17 PM Appreciate consultation from [facility] hospitalist, Dr. Marin Olp who agrees to see and admit pt for further evaluation.  She requested for a chest CTA to be obtained.  Pt currently receiving broad spectrum abx.  He has no nuchal rigidity to suggest meningitis.  Sister did report pt went on a long road trip 3 weeks ago.  No report of new calf pain  or leg swelling.    CRITICAL CARE Performed by: Fayrene Helper Total  critical care time: 35 minutes Critical care time was exclusive of separately billable procedures and treating other patients. Critical care was necessary to treat or prevent imminent or life-threatening deterioration. Critical care was time spent personally by me on the following activities: development of treatment plan with patient and/or surrogate as well as nursing, discussions with consultants, evaluation of patient's response to treatment, examination of patient, obtaining history from patient or surrogate, ordering and performing treatments and interventions, ordering and review of laboratory studies, ordering and review of radiographic studies, pulse oximetry and re-evaluation of patient's condition.    Fayrene Helper, PA-C 04/13/17 1620

## 2017-04-13 NOTE — ED Notes (Signed)
Placed on 2 LP o2

## 2017-04-13 NOTE — ED Provider Notes (Signed)
Medical screening examination/treatment/procedure(s) were conducted as a shared visit with non-physician practitioner(s) and myself.  I personally evaluated the patient during the encounter. Briefly, the patient is a 37 y.o. male here with several days of productive cough, subjective fevers and chills headache, generalized fatigue several near syncopal episodes.  Patient denies any associated nausea, vomiting, abdominal pain, diarrhea, chest pain, shortness of breath, urinary symptoms.  Patient noted to be febrile, tachycardic and hypotensive.  Code sepsis was initiated.  Empiric antibiotics were ordered.  30 cc/kg of IV fluids were initiated. Source unknown. Admitted for further work up and management.   EKG Interpretation  Date/Time:  Sunday April 13 2017 15:53:49 EST Ventricular Rate:  107 PR Interval:    QRS Duration: 76 QT Interval:  337 QTC Calculation: 450 R Axis:   93 Text Interpretation:  Sinus tachycardia Borderline right axis deviation Low voltage, precordial leads Borderline T wave abnormalities No acute changes Nonspecific ST and T wave abnormality Confirmed by Derwood KaplanNanavati, Ankit [phone]([id number]) on 04/13/2017 6:03:24 PM      CRITICAL CARE Performed by: Amadeo GarnetPedro Eduardo Lively Haberman Total critical care time: 15 minutes Critical care time was exclusive of separately billable procedures and treating other patients. Critical care was necessary to treat or prevent imminent or life-threatening deterioration. Critical care was time spent personally by me on the following activities: development of treatment plan with patient and/or surrogate as well as nursing, discussions with consultants, evaluation of patient's response to treatment, examination of patient, obtaining history from patient or surrogate, ordering and performing treatments and interventions, ordering and review of laboratory studies, ordering and review of radiographic studies, pulse oximetry and re-evaluation of patient's  condition.       Nira Connardama, Illa Enlow Eduardo, MD 04/13/17 [phone]

## 2017-04-13 NOTE — ED Triage Notes (Signed)
Patient complains of fever, body aches, chills and fatigue with decreased appetite x 3 days. Patient states that he thinks he is dehydrated. Alert and oriented. Nausea with same

## 2017-04-13 NOTE — ED Notes (Signed)
Patient returned from CT

## 2017-04-14 ENCOUNTER — Other Ambulatory Visit: Payer: Self-pay

## 2017-04-14 ENCOUNTER — Encounter (HOSPITAL_COMMUNITY): Payer: Self-pay | Admitting: General Practice

## 2017-04-14 DIAGNOSIS — A419 Sepsis, unspecified organism: Secondary | ICD-10-CM | POA: Diagnosis present

## 2017-04-14 DIAGNOSIS — F209 Schizophrenia, unspecified: Secondary | ICD-10-CM

## 2017-04-14 DIAGNOSIS — R6889 Other general symptoms and signs: Secondary | ICD-10-CM | POA: Insufficient documentation

## 2017-04-14 DIAGNOSIS — G40909 Epilepsy, unspecified, not intractable, without status epilepticus: Secondary | ICD-10-CM

## 2017-04-14 DIAGNOSIS — J9621 Acute and chronic respiratory failure with hypoxia: Secondary | ICD-10-CM

## 2017-04-14 DIAGNOSIS — J189 Pneumonia, unspecified organism: Secondary | ICD-10-CM | POA: Diagnosis present

## 2017-04-14 LAB — CBC
HEMATOCRIT: 37.1 % — AB (ref 39.0–52.0)
Hemoglobin: 12 g/dL — ABNORMAL LOW (ref 13.0–17.0)
MCH: 28.6 pg (ref 26.0–34.0)
MCHC: 32.3 g/dL (ref 30.0–36.0)
MCV: 88.5 fL (ref 78.0–100.0)
PLATELETS: 222 10*3/uL (ref 150–400)
RBC: 4.19 MIL/uL — ABNORMAL LOW (ref 4.22–5.81)
RDW: 15.7 % — ABNORMAL HIGH (ref 11.5–15.5)
WBC: 13.5 10*3/uL — ABNORMAL HIGH (ref 4.0–10.5)

## 2017-04-14 LAB — BASIC METABOLIC PANEL
ANION GAP: 12 (ref 5–15)
BUN: 5 mg/dL — ABNORMAL LOW (ref 6–20)
CO2: 24 mmol/L (ref 22–32)
Calcium: 8.4 mg/dL — ABNORMAL LOW (ref 8.9–10.3)
Chloride: 107 mmol/L (ref 101–111)
Creatinine, Ser: 0.88 mg/dL (ref 0.61–1.24)
GFR calc non Af Amer: >60 mL/min (ref >60)
Glucose, Bld: 100 mg/dL — ABNORMAL HIGH (ref 65–99)
Potassium: 4.5 mmol/L (ref 3.5–5.1)
SODIUM: 143 mmol/L (ref 135–145)

## 2017-04-14 LAB — GLUCOSE, CAPILLARY
Glucose-Capillary: 115 mg/dL — ABNORMAL HIGH (ref 65–99)
Glucose-Capillary: 95 mg/dL (ref 65–99)

## 2017-04-14 LAB — HIV ANTIBODY (ROUTINE TESTING W REFLEX): HIV Screen 4th Generation wRfx: NONREACTIVE

## 2017-04-14 LAB — CBG MONITORING, ED
Glucose-Capillary: 96 mg/dL (ref 65–99)
Glucose-Capillary: 99 mg/dL (ref 65–99)

## 2017-04-14 MED ORDER — AZITHROMYCIN 250 MG PO TABS
500.0000 mg | ORAL_TABLET | Freq: Every day | ORAL | Status: AC
  Administered 2017-04-14: 500 mg via ORAL
  Filled 2017-04-14: qty 2

## 2017-04-14 MED ORDER — GUAIFENESIN-DM 100-10 MG/5ML PO SYRP
10.0000 mL | ORAL_SOLUTION | ORAL | Status: DC | PRN
  Administered 2017-04-14 (×2): 10 mL via ORAL
  Filled 2017-04-14 (×2): qty 10

## 2017-04-14 MED ORDER — SODIUM CHLORIDE 0.9 % IV SOLN
1.0000 g | INTRAVENOUS | Status: DC
  Administered 2017-04-14 – 2017-04-15 (×2): 1 g via INTRAVENOUS
  Filled 2017-04-14 (×2): qty 10

## 2017-04-14 MED ORDER — AZITHROMYCIN 500 MG PO TABS
250.0000 mg | ORAL_TABLET | Freq: Every day | ORAL | Status: DC
  Administered 2017-04-15: 250 mg via ORAL
  Filled 2017-04-14: qty 1

## 2017-04-14 NOTE — Progress Notes (Signed)
Marcus HolesBrian Hane [id number] Admission Data: 04/14/2017 6:11 PM Attending Provider: Lucy Antiguaama, Christina P, MD  WUJ:[id number]:Avbuere, Dorma RussellEdwin, MD Consults/ Treatment Team:   Marcus Chang is a 37 y.o. male patient admitted from ED awake, alert  & orientated  X 3,  Full Code, VSS - Blood pressure 98/79, pulse (!) 105, temperature 98.4 F (36.9 C), temperature source Oral, resp. rate 18, SpO2 99 %., IV site WDL:  antecubital left, condition patent and no redness with a transparent dsg that's clean dry and intact.  Allergies:  No Known Allergies   Past Medical History:  Diagnosis Date  . Asthma   . Diabetes mellitus without complication (HCC)   . Hypertension   . Mental retardation   . Schizophrenia (HCC)   . Spondylolysis     History:  obtained from the patient. Tobacco/alcohol: Smokes cigarrettes none  Pt orientation to unit, room and routine. Information packet given to patient/family and safety video watched.  Admission INP armband ID verified with patient/family, and in place. SR up x 2, fall risk assessment complete with Patient and family verbalizing understanding of risks associated with falls. Pt verbalizes an understanding of how to use the call bell and to call for help before getting out of bed.  Skin, clean-dry- intact without evidence of bruising, or skin tears.   No evidence of skin break down noted on exam. no rashes, no ecchymoses, no petechiae    Will cont to monitor and assist as needed.  Kacey Vicuna Consuella Loselaine, RN 04/14/2017 6:11 PM

## 2017-04-14 NOTE — Progress Notes (Signed)
Progress Note    Marcus Chang  ZOX:[id number] DOB: Jul 15, 1980  DOA: 04/13/2017 PCP: Fleet Contras, MD    Brief Narrative:   Chief complaint: F/U pneumonia  Medical records reviewed and are as summarized below:  Marcus Chang is an 37 y.o. male with a PMH of schizophrenia, diabetes, COPD with ongoing tobacco abuse who was admitted 04/13/17 for evaluation of URI symptoms including dyspnea, cough, fever, chills, weakness and dizziness. Thought to be septic on admission and was fluid volume resuscitated and started on broad-spectrum antibiotics in the ED including vancomycin and Zosyn.  Assessment/Plan:   Principal Problem:   Sepsis associated with acute hypoxic respiratory failure secondary to multifocal pneumonia  CT of the chest does confirm multifocal pneumonia that was not visible on chest x-ray, with all studies personally reviewed. Lactic acid cleared with IV fluid resuscitation. WBC improving on broad-spectrum antibiotics. Remains mildly hypotensive. Remains mildly tachypneic with an oxygen requirement that is new. Initially placed on empiric vancomycin, Zosyn. Follow-up blood cultures. Discontinue Tamiflu, influenza panel negative. Narrow antibiotics to Rocephin/azithromycin.  Active Problems:   Type 2 diabetes mellitus without complication (HCC) Currently being managed with insulin sensitive SSI 3 times a day. CBGs controlled 96-103.    Schizophrenia (HCC)/depression Continue Prozac, Risperdal, Depakote, Thorazine and Artane.    COPD (chronic obstructive pulmonary disease) (HCC) Continue bronchodilators and supplemental oxygen.    Tobacco abuse Continue nicotine patch.    Seizure disorder (HCC) Continue Neurontin.   Family Communication/Anticipated D/C date and plan/Code Status   DVT prophylaxis: Lovenox ordered. Code Status: Full Code.  Family Communication: No family at the bedside. Disposition Plan: Home when fever improved, likely another 24-48  hours.   Medical Consultants:    None.   Anti-Infectives:    Vancomycin 04/13/17---> 04/14/17  Zosyn 04/13/17---> 04/14/17  Rocephin 04/14/17--->  Azithromycin 04/14/17--->  Subjective:   The patient continues to feel bad. Mildly short of breath with cough. Appetite fair. Feels weak.  Objective:    Vitals:   04/14/17 0200 04/14/17 0330 04/14/17 0430 04/14/17 0500  BP: (!) 105/52 104/66 114/80 108/65  Pulse: (!) 101 93 92 87  Resp: (!) 23 20 19 19   Temp:      TempSrc:      SpO2: 92% 96% 97% 96%    Intake/Output Summary (Last 24 hours) at 04/14/2017 0823 Last data filed at 04/14/2017 0617 Gross per 24 hour  Intake 1300 ml  Output 1880 ml  Net -580 ml   There were no vitals filed for this visit.  Exam: General: Ill-appearing male. Cardiovascular: Heart sounds show a regular rate, and rhythm. No gallops or rubs. No murmurs. No JVD. Lungs: Diminished with scattered rhonchi throughout. Abdomen: Soft, nontender, nondistended with normal active bowel sounds. No masses. No hepatosplenomegaly. Neurological: Alert and oriented 3. Moves all extremities 4 with equal strength. Cranial nerves II through XII grossly intact. Skin: Warm and dry. No rashes or lesions. Extremities: No clubbing or cyanosis. No edema. Pedal pulses 2+. Psychiatric: Mood and affect are normal. Insight and judgment are fair.   Data Reviewed:   I have personally reviewed following labs and imaging studies:  Labs: Labs show the following:   Basic Metabolic Panel: Recent Labs  Lab 04/13/17 1259 04/13/17 1851 04/14/17 0410  NA 132*  --  143  K 4.3  --  4.5  CL 94*  --  107  CO2 24  --  24  GLUCOSE 113*  --  100*  BUN 9  --  5*  CREATININE 1.58* 1.21 0.88  CALCIUM 8.9  --  8.4*   GFR CrCl cannot be calculated (Unknown ideal weight.).  CBC: Recent Labs  Lab 04/13/17 1259 04/13/17 1851 04/14/17 0410  WBC 18.6* 16.7* 13.5*  HGB 13.3 11.9* 12.0*  HCT 40.2 35.8* 37.1*  MCV 87.6 87.3  88.5  PLT 245 206 222   CBG: Recent Labs  Lab 04/13/17 1304 04/14/17 0748  GLUCAP 103* 96   DSepsis Labs: Recent Labs  Lab 04/13/17 1259 04/13/17 1315 04/13/17 1635 04/13/17 1851 04/14/17 0410  WBC 18.6*  --   --  16.7* 13.5*  LATICACIDVEN  --  2.31* 1.28  --   --     Microbiology No results found for this or any previous visit (from the past 240 hour(s)).  Procedures and diagnostic studies:  Dg Chest 2 View  Result Date: 04/13/2017 CLINICAL DATA:  Cough.  Sepsis. EXAM: CHEST  2 VIEW COMPARISON:  Jul 15, 2008 FINDINGS: The heart is borderline to mildly enlarged. The hila and mediastinum are unchanged. No pneumothorax. No nodules or masses. No focal infiltrates. IMPRESSION: No active cardiopulmonary disease. Electronically Signed   By: Gerome Samavid  Williams III M.D   On: 04/13/2017 15:51   Ct Angio Chest Pe W And/or Wo Contrast  Result Date: 04/13/2017 CLINICAL DATA:  Productive cough.  Shortness of breath. EXAM: CT ANGIOGRAPHY CHEST WITH CONTRAST TECHNIQUE: Multidetector CT imaging of the chest was performed using the standard protocol during bolus administration of intravenous contrast. Multiplanar CT image reconstructions and MIPs were obtained to evaluate the vascular anatomy. CONTRAST:  60 cc of Isovue 300 COMPARISON:  Chest x-ray April 13, 2017.  Chest CT Jul 14, 2008 FINDINGS: Cardiovascular: The thoracic aorta is nonaneurysmal with no dissection or atherosclerosis. The coronary arteries are grossly unremarkable without obvious atherosclerotic change. The heart size is normal to borderline. No pulmonary emboli. Mediastinum/Nodes: Mildly prominent subcarinal node measures 15 mm. Prominent nodes in the right hilum. Prominent right paratracheal node on image 44 measuring 13 mm. No other adenopathy in the chest. No effusions. The thyroid and esophagus are normal. Lungs/Pleura: Central airways are normal. No pneumothorax. Patchy opacities are seen in all lobes, most prominent on the  right. No interlobular septal thickening. No pulmonary nodules or masses. Upper Abdomen: No acute abnormality. Musculoskeletal: No chest wall abnormality. No acute or significant osseous findings. Review of the MIP images confirms the above findings. IMPRESSION: 1. Patchy infiltrates are seen in the lungs bilaterally affecting all lobes, right greater than left. These are not visible on today's chest x-ray. Given history, this is likely infectious/pneumonia. Atypical infections should be considered. Recommend short-term follow-up to ensure resolution. 2. Prominent nodes in the right hilum and subcarinal region are likely reactive. Recommend attention on follow-up. Electronically Signed   By: Gerome Samavid  Williams III M.D   On: 04/13/2017 18:03    Medications:   . aspirin EC  81 mg Oral Daily  . atorvastatin  40 mg Oral q1800  . chlorproMAZINE  50 mg Oral BID  . divalproex  250 mg Oral QHS  . enoxaparin (LOVENOX) injection  40 mg Subcutaneous Q24H  . FLUoxetine  10 mg Oral QPC breakfast  . gabapentin  300 mg Oral BID  . hydrOXYzine  50 mg Oral QHS  . insulin aspart  0-9 Units Subcutaneous TID WC  . ipratropium-albuterol  3 mL Nebulization Q6H  . nicotine  21 mg Transdermal Daily  . pantoprazole  40 mg Oral Daily  . risperidone  4  mg Oral BID  . trihexyphenidyl  2 mg Oral Daily   Continuous Infusions: . sodium chloride 150 mL/hr at 04/14/17 0619  . piperacillin-tazobactam (ZOSYN)  IV Stopped (04/14/17 0423)  . vancomycin Stopped (04/14/17 0617)     LOS: 1 day   Hillery Aldo  [facility] Pager [phone]. If unable to reach me by pager, please call my cell phone at [phone].  *Please refer to [email or web address], password [email or web address] to get updated schedule on who will round on this patient, as hospitalists switch teams weekly. If 7PM-7AM, please contact night-coverage at [email or web address], password [email or web address] for any overnight needs.  04/14/2017, 8:23 AM

## 2017-04-15 DIAGNOSIS — Z716 Tobacco abuse counseling: Secondary | ICD-10-CM

## 2017-04-15 LAB — BASIC METABOLIC PANEL
ANION GAP: 13 (ref 5–15)
BUN: 5 mg/dL — ABNORMAL LOW (ref 6–20)
CHLORIDE: 101 mmol/L (ref 101–111)
CO2: 25 mmol/L (ref 22–32)
Calcium: 9.2 mg/dL (ref 8.9–10.3)
Creatinine, Ser: 0.73 mg/dL (ref 0.61–1.24)
GFR calc non Af Amer: >60 mL/min (ref >60)
Glucose, Bld: 92 mg/dL (ref 65–99)
Potassium: 4.3 mmol/L (ref 3.5–5.1)
Sodium: 139 mmol/L (ref 135–145)

## 2017-04-15 LAB — GLUCOSE, CAPILLARY
GLUCOSE-CAPILLARY: 103 mg/dL — AB (ref 65–99)
Glucose-Capillary: 78 mg/dL (ref 65–99)
Glucose-Capillary: 94 mg/dL (ref 65–99)

## 2017-04-15 LAB — CBC
HEMATOCRIT: 35.8 % — AB (ref 39.0–52.0)
HEMOGLOBIN: 11.6 g/dL — AB (ref 13.0–17.0)
MCH: 28.7 pg (ref 26.0–34.0)
MCHC: 32.4 g/dL (ref 30.0–36.0)
MCV: 88.6 fL (ref 78.0–100.0)
Platelets: 229 10*3/uL (ref 150–400)
RBC: 4.04 MIL/uL — ABNORMAL LOW (ref 4.22–5.81)
RDW: 15.4 % (ref 11.5–15.5)
WBC: 15.8 10*3/uL — AB (ref 4.0–10.5)

## 2017-04-15 MED ORDER — CEFUROXIME AXETIL 500 MG PO TABS: 500.0000 mg | ORAL_TABLET | Freq: Two times a day (BID) | ORAL | 0 refills | Status: AC

## 2017-04-15 MED ORDER — AZITHROMYCIN 250 MG PO TABS: 250.0000 mg | ORAL_TABLET | Freq: Every day | ORAL | 0 refills | Status: AC

## 2017-04-15 MED ORDER — GUAIFENESIN-DM 100-10 MG/5ML PO SYRP: 10.0000 mL | ORAL_SOLUTION | ORAL | 0 refills | Status: AC | PRN

## 2017-04-15 MED ORDER — GLIPIZIDE 10 MG PO TABS: 10.0000 mg | ORAL_TABLET | Freq: Two times a day (BID) | ORAL | 11 refills | Status: DC

## 2017-04-15 MED ORDER — IPRATROPIUM-ALBUTEROL 0.5-2.5 (3) MG/3ML IN SOLN
3.0000 mL | Freq: Three times a day (TID) | RESPIRATORY_TRACT | Status: DC
  Administered 2017-04-15 (×2): 3 mL via RESPIRATORY_TRACT
  Filled 2017-04-15 (×2): qty 3

## 2017-04-15 NOTE — Discharge Summary (Signed)
Physician Discharge Summary  Marcus Chang ZOX:[id number] DOB: 1980-08-09 DOA: 04/13/2017  PCP: Fleet Contras, MD  Admit date: 04/13/2017 Discharge date: 04/15/2017  Admitted From: Home Discharge disposition: Home   Recommendations for Outpatient Follow-Up:   1. Recommend outpatient follow-up in 1 week to ensure resolution of symptoms.   Discharge Diagnosis:   Principal Problem:   Sepsis (HCC) Active Problems:   Hypoxia   Type 2 diabetes mellitus without complication (HCC)   Schizophrenia (HCC)   COPD (chronic obstructive pulmonary disease) (HCC)   Depression   Seizure disorder (HCC)   Multifocal pneumonia   Acute on chronic respiratory failure with hypoxia (HCC)  Discharge Condition: Improved.  Diet recommendation: Low sodium, heart healthy.  Carbohydrate-modified.    Code status: Full.   History of Present Illness:   Marcus Chang is an 37 y.o. male with a PMH of schizophrenia, diabetes, COPD with ongoing tobacco abuse who was admitted 04/13/17 for evaluation of URI symptoms including dyspnea, cough, fever, chills, weakness and dizziness. Thought to be septic on admission and was fluid volume resuscitated and started on broad-spectrum antibiotics in the ED including vancomycin and Zosyn.  Hospital Course by Problem:   Principal Problem:   Sepsis associated with acute hypoxic respiratory failure secondary to multifocal pneumonia  CT of the chest showed multifocal pneumonia that was not visible on chest x-ray. Lactic acid cleared with IV fluid resuscitation. WBC improving on broad-spectrum antibiotics. Initially placed on empiric vancomycin, Zosyn but antibiotics narrowed to Rocephin/azithromycin 04/14/17. Blood cultures negative to date. Discharge home on Ceftin/oral azithromycin.  Active Problems:   Type 2 diabetes mellitus without complication (HCC) Currently being managed with insulin sensitive SSI 3 times a day. CBGs controlled 78-115. Resume metformin at  discharge.    Schizophrenia (HCC)/depression Continue Prozac, Risperdal, Depakote, Thorazine and Artane.    COPD (chronic obstructive pulmonary disease) (HCC) Continue bronchodilators and supplemental oxygen.    Tobacco abuse Continue nicotine patch. Counseled however patient does not appear to be receptive to strategies for quitting. Declined prescription for nicotine patch at discharge.    Seizure disorder (HCC) Continue Neurontin.   Medical Consultants:    None.   Discharge Exam:   Vitals:   04/15/17 0819 04/15/17 1233  BP:  117/76  Pulse:  88  Resp:  18  Temp:  98.1 F (36.7 C)  SpO2: 90% 91%   Vitals:   04/14/17 2208 04/15/17 0616 04/15/17 0819 04/15/17 1233  BP: 107/68 (!) 115/57  117/76  Pulse: 95 84  88  Resp: 18 18  18   Temp: (!) 97.4 F (36.3 C) 98.1 F (36.7 C)  98.1 F (36.7 C)  TempSrc: Oral Oral  Oral  SpO2: 92% 94% 90% 91%  Weight:      Height:        General exam: Appears calm and comfortable.  Respiratory system: Clear to auscultation, diminished. Respiratory effort normal. Cardiovascular system: S1 & S2 heard, RRR. No JVD,  rubs, gallops or clicks. No murmurs. Gastrointestinal system: Abdomen is nondistended, soft and nontender. No organomegaly or masses felt. Normal bowel sounds heard. Central nervous system: Alert and oriented. No focal neurological deficits. Extremities: No clubbing,  or cyanosis. No edema. Skin: No rashes, lesions or ulcers. Psychiatry: Judgement and insight appear normal. Mood & affect appropriate.    The results of significant diagnostics from this hospitalization (including imaging, microbiology, ancillary and laboratory) are listed below for reference.     Procedures and Diagnostic Studies:   Dg Chest 2 View  Result  Date: 04/13/2017 CLINICAL DATA:  Cough.  Sepsis. EXAM: CHEST  2 VIEW COMPARISON:  Jul 15, 2008 FINDINGS: The heart is borderline to mildly enlarged. The hila and mediastinum are unchanged. No  pneumothorax. No nodules or masses. No focal infiltrates. IMPRESSION: No active cardiopulmonary disease. Electronically Signed   By: Gerome Samavid  Williams III M.D   On: 04/13/2017 15:51   Ct Angio Chest Pe W And/or Wo Contrast  Result Date: 04/13/2017 CLINICAL DATA:  Productive cough.  Shortness of breath. EXAM: CT ANGIOGRAPHY CHEST WITH CONTRAST TECHNIQUE: Multidetector CT imaging of the chest was performed using the standard protocol during bolus administration of intravenous contrast. Multiplanar CT image reconstructions and MIPs were obtained to evaluate the vascular anatomy. CONTRAST:  60 cc of Isovue 300 COMPARISON:  Chest x-ray April 13, 2017.  Chest CT Jul 14, 2008 FINDINGS: Cardiovascular: The thoracic aorta is nonaneurysmal with no dissection or atherosclerosis. The coronary arteries are grossly unremarkable without obvious atherosclerotic change. The heart size is normal to borderline. No pulmonary emboli. Mediastinum/Nodes: Mildly prominent subcarinal node measures 15 mm. Prominent nodes in the right hilum. Prominent right paratracheal node on image 44 measuring 13 mm. No other adenopathy in the chest. No effusions. The thyroid and esophagus are normal. Lungs/Pleura: Central airways are normal. No pneumothorax. Patchy opacities are seen in all lobes, most prominent on the right. No interlobular septal thickening. No pulmonary nodules or masses. Upper Abdomen: No acute abnormality. Musculoskeletal: No chest wall abnormality. No acute or significant osseous findings. Review of the MIP images confirms the above findings. IMPRESSION: 1. Patchy infiltrates are seen in the lungs bilaterally affecting all lobes, right greater than left. These are not visible on today's chest x-ray. Given history, this is likely infectious/pneumonia. Atypical infections should be considered. Recommend short-term follow-up to ensure resolution. 2. Prominent nodes in the right hilum and subcarinal region are likely reactive.  Recommend attention on follow-up. Electronically Signed   By: Gerome Samavid  Williams III M.D   On: 04/13/2017 18:03     Labs:   Basic Metabolic Panel: Recent Labs  Lab 04/13/17 1259 04/13/17 1851 04/14/17 0410 04/15/17 0329  NA 132*  --  143 139  K 4.3  --  4.5 4.3  CL 94*  --  107 101  CO2 24  --  24 25  GLUCOSE 113*  --  100* 92  BUN 9  --  5* 5*  CREATININE 1.58* 1.21 0.88 0.73  CALCIUM 8.9  --  8.4* 9.2   GFR Estimated Creatinine Clearance: 127.9 mL/min (by C-G formula based on SCr of 0.73 mg/dL).  CBC: Recent Labs  Lab 04/13/17 1259 04/13/17 1851 04/14/17 0410 04/15/17 0329  WBC 18.6* 16.7* 13.5* 15.8*  HGB 13.3 11.9* 12.0* 11.6*  HCT 40.2 35.8* 37.1* 35.8*  MCV 87.6 87.3 88.5 88.6  PLT 245 206 222 229   CBG: Recent Labs  Lab 04/14/17 1158 04/14/17 1705 04/14/17 2208 04/15/17 0746 04/15/17 1225  GLUCAP 99 115* 95 78 94   Microbiology Recent Results (from the past 240 hour(s))  Blood Culture (routine x 2)     Status: None (Preliminary result)   Collection Time: 04/13/17  3:04 PM  Result Value Ref Range Status   Specimen Description BLOOD LEFT ANTECUBITAL  Final   Special Requests   Final    BOTTLES DRAWN AEROBIC AND ANAEROBIC Blood Culture adequate volume   Culture   Final    NO GROWTH < 24 HOURS Performed at [facility], [address]  [place], [place] [address]    Report Status PENDING  Incomplete  Blood Culture (routine x 2)     Status: None (Preliminary result)   Collection Time: 04/13/17  3:04 PM  Result Value Ref Range Status   Specimen Description BLOOD LEFT ANTECUBITAL  Final   Special Requests   Final    BOTTLES DRAWN AEROBIC AND ANAEROBIC Blood Culture results may not be optimal due to an excessive volume of blood received in culture bottles   Culture   Final    NO GROWTH < 24 HOURS Performed at [facility], [address] [place], [place] [address]    Report Status PENDING  Incomplete     Discharge Instructions:    Discharge Instructions    Call MD for:  difficulty breathing, headache or visual disturbances   Complete by:  As directed    Call MD for:  extreme fatigue   Complete by:  As directed    Call MD for:  persistant dizziness or light-headedness   Complete by:  As directed    Call MD for:  temperature >100.4   Complete by:  As directed    Diet - low sodium heart healthy   Complete by:  As directed    Diet Carb Modified   Complete by:  As directed    Increase activity slowly   Complete by:  As directed      Allergies as of 04/15/2017   No Known Allergies     Medication List    TAKE these medications   albuterol 108 (90 Base) MCG/ACT inhaler Commonly known as:  PROVENTIL HFA;VENTOLIN HFA Inhale 2 puffs into the lungs 4 (four) times daily as needed for wheezing or shortness of breath.   aspirin EC 81 MG tablet Take 81 mg by mouth daily.   azithromycin 250 MG tablet Commonly known as:  ZITHROMAX Take 1 tablet (250 mg total) by mouth daily.   cefUROXime 500 MG tablet Commonly known as:  CEFTIN Take 1 tablet (500 mg total) by mouth 2 (two) times daily for 10 days.   chlorproMAZINE 25 MG tablet Commonly known as:  THORAZINE Take 50 mg by mouth 2 (two) times daily.   divalproex 250 MG 24 hr tablet Commonly known as:  DEPAKOTE ER Take 250 mg by mouth at bedtime.   FLUoxetine 10 MG capsule Commonly known as:  PROZAC Take 10 mg by mouth daily after breakfast.   gabapentin 300 MG capsule Commonly known as:  NEURONTIN Take 300 mg by mouth See admin instructions. Take one capsule (300 mg) by mouth twice daily, may also take one capsule (300 mg) at 2pm as needed for back pain   guaiFENesin-dextromethorphan 100-10 MG/5ML syrup Commonly known as:  ROBITUSSIN DM Take 10 mLs by mouth every 4 (four) hours as needed for cough.   hydrOXYzine 50 MG capsule Commonly known as:  VISTARIL Take 50 mg by mouth at bedtime.   levocetirizine 5 MG tablet Commonly known as:  XYZAL Take 5  mg by mouth daily as needed for allergies.   lisinopril 20 MG tablet Commonly known as:  PRINIVIL,ZESTRIL Take 20 mg by mouth daily.   metFORMIN 500 MG tablet Commonly known as:  GLUCOPHAGE Take 500 mg by mouth 2 (two) times daily with a meal.   multivitamin with minerals Tabs tablet Take 1 tablet by mouth daily.   omeprazole 20 MG capsule Commonly known as:  PRILOSEC Take 20 mg by mouth daily.   risperidone 4 MG tablet  Commonly known as:  RISPERDAL Take 4 mg by mouth 2 (two) times daily.   simvastatin 80 MG tablet Commonly known as:  ZOCOR Take 80 mg by mouth at bedtime.   tiZANidine 4 MG tablet Commonly known as:  ZANAFLEX TAKE 1 TABLET (4 MG TOTAL) BY MOUTH TWO (TWO) TIMES DAILY AS NEEDED FOR MUSCLE SPASMS.   traMADol 50 MG tablet Commonly known as:  ULTRAM TAKE 1 TO TWO TABLETS BY MOUTH EVERY SIX HOURS AS NEEDED   trihexyphenidyl 2 MG tablet Commonly known as:  ARTANE Take 2 mg by mouth daily.   zolpidem 10 MG tablet Commonly known as:  AMBIEN Take 10 mg by mouth at bedtime as needed for sleep.      Follow-up Information    Fleet Contras, MD. Schedule an appointment as soon as possible for a visit in 1 week(s).   Specialty:  Internal Medicine Why:  Hospital follow up of diabetes, pneumonia Contact information: [address] [place] [address] [phone]            Time coordinating discharge: 35 minutes.  SignedTrula Ore Courtany Mcmurphy  Pager [phone] [facility] 04/15/2017, 1:21 PM

## 2017-04-15 NOTE — Discharge Instructions (Signed)
Antibiotic Medicine, Adult  Antibiotic medicines treat infections caused by a type of germ called bacteria. They work by killing the bacteria that make you sick.  When do I need to take antibiotics?  You often need these medicines to treat bacterial infections, such as:  · A urinary tract infection (UTI).  · Strep throat.  · Meningitis. This affects the spinal cord and brain.  · A bad lung infection.    You may start the medicines while your doctor waits for tests to come back. When the tests come back, your doctor may change or stop your medicine.  When are antibiotics not needed?  You do not need these medicines for most common illnesses, such as:  · A cold.  · The flu.  · A sore throat.    Antibiotics are not always needed for all infections caused by bacteria. Do not ask for these medicines, or take them, when they are not needed.  What are the risks of taking antibiotics?  Most antibiotics can cause an infection called Clostridium difficile.This causes watery poop (diarrhea). Let your doctor know right away if:  · You have watery poop while taking an antibiotic.  · You have watery poop after you stop taking an antibiotic. The illness can happen weeks after you stop the medicine.    You also have a risk of getting an infection in the future that antibiotics cannot treat (antibiotic-resistant infection). This type of infection can be dangerous.  What else should I know about taking antibiotics?  · You need to take the entire prescription.  ? Take the medicine for as long as told by your doctor.  ? Do not stop taking it even if you start to feel better.  · Try not to miss any doses. If you miss a dose, call your doctor.  · Birth control pills may not work. If you take birth control pills:  ? Keep on taking them.  ? Use a second form of birth control, such as a condom. Do this for as long as told by your doctor.  · Ask your doctor:  ? How long to wait in between doses.  ? If you should take the medicine with  food.  ? If there is anything you should stay away from while taking the antibiotic, such as:  ? Food.  ? Drinks.  ? Medicines.  ? If there are any side effects you should watch for.  · Only take the medicines that your doctor told you to take. Do not take medicines that were given to someone else.  · Drink a large glass of water with the medicine.  · Ask the pharmacist for a tool to measure the medicine, such as:  ? A syringe.  ? A cup.  ? A spoon.  · Throw away any extra medicine.  Contact a doctor if:  · You get worse.  · You have new joint pain or muscle aches after starting the medicine.  · You have side effects from the medicine, such as:  ? Stomach pain.  ? Watery poop.  ? Feeling sick to your stomach (nausea).  Get help right away if:  · You have signs of a very bad allergic reaction. If this happens, stop taking the medicine right away. Signs may include:  ? Hives. These are raised, itchy, red bumps on the skin.  ? Skin rash.  ? Trouble breathing.  ? Wheezing.  ? Swelling.  ? Feeling dizzy.  ? Throwing up (  vomiting).  · Your pee (urine) is dark, or is the color of blood.  · Your skin turns yellow.  · You bruise easily.  · You bleed easily.  · You have very bad watery poop and cramps in your belly.  · You have a very bad headache.  Summary  · Antibiotics are often used to treat infections caused by bacteria.  · Only take these medicines when needed.  · Let your doctor know if you have watery poop while taking an antibiotic.  · You need to take the entire prescription.  This information is not intended to replace advice given to you by your health care provider. Make sure you discuss any questions you have with your health care provider.  Document Released: 11/21/2007 Document Revised: 02/14/2016 Document Reviewed: 02/14/2016  Elsevier Interactive Patient Education © 2017 [employer]

## 2017-04-15 NOTE — Progress Notes (Signed)
Nsg Discharge Note  Admit Date:  04/13/2017 Discharge date: 04/15/2017   Emi Holes to be D/C'd Home per MD order.  AVS completed.  Copy for chart, and copy for patient signed, and dated. Patient/caregiver able to verbalize understanding.  Discharge Medication: Allergies as of 04/15/2017   No Known Allergies     Medication List    TAKE these medications   albuterol 108 (90 Base) MCG/ACT inhaler Commonly known as:  PROVENTIL HFA;VENTOLIN HFA Inhale 2 puffs into the lungs 4 (four) times daily as needed for wheezing or shortness of breath.   aspirin EC 81 MG tablet Take 81 mg by mouth daily.   azithromycin 250 MG tablet Commonly known as:  ZITHROMAX Take 1 tablet (250 mg total) by mouth daily.   cefUROXime 500 MG tablet Commonly known as:  CEFTIN Take 1 tablet (500 mg total) by mouth 2 (two) times daily for 10 days.   chlorproMAZINE 25 MG tablet Commonly known as:  THORAZINE Take 50 mg by mouth 2 (two) times daily.   divalproex 250 MG 24 hr tablet Commonly known as:  DEPAKOTE ER Take 250 mg by mouth at bedtime.   FLUoxetine 10 MG capsule Commonly known as:  PROZAC Take 10 mg by mouth daily after breakfast.   gabapentin 300 MG capsule Commonly known as:  NEURONTIN Take 300 mg by mouth See admin instructions. Take one capsule (300 mg) by mouth twice daily, may also take one capsule (300 mg) at 2pm as needed for back pain   guaiFENesin-dextromethorphan 100-10 MG/5ML syrup Commonly known as:  ROBITUSSIN DM Take 10 mLs by mouth every 4 (four) hours as needed for cough.   hydrOXYzine 50 MG capsule Commonly known as:  VISTARIL Take 50 mg by mouth at bedtime.   levocetirizine 5 MG tablet Commonly known as:  XYZAL Take 5 mg by mouth daily as needed for allergies.   lisinopril 20 MG tablet Commonly known as:  PRINIVIL,ZESTRIL Take 20 mg by mouth daily.   metFORMIN 500 MG tablet Commonly known as:  GLUCOPHAGE Take 500 mg by mouth 2 (two) times daily with a  meal.   multivitamin with minerals Tabs tablet Take 1 tablet by mouth daily.   omeprazole 20 MG capsule Commonly known as:  PRILOSEC Take 20 mg by mouth daily.   risperidone 4 MG tablet Commonly known as:  RISPERDAL Take 4 mg by mouth 2 (two) times daily.   simvastatin 80 MG tablet Commonly known as:  ZOCOR Take 80 mg by mouth at bedtime.   tiZANidine 4 MG tablet Commonly known as:  ZANAFLEX TAKE 1 TABLET (4 MG TOTAL) BY MOUTH TWO (TWO) TIMES DAILY AS NEEDED FOR MUSCLE SPASMS.   traMADol 50 MG tablet Commonly known as:  ULTRAM TAKE 1 TO TWO TABLETS BY MOUTH EVERY SIX HOURS AS NEEDED   trihexyphenidyl 2 MG tablet Commonly known as:  ARTANE Take 2 mg by mouth daily.   zolpidem 10 MG tablet Commonly known as:  AMBIEN Take 10 mg by mouth at bedtime as needed for sleep.       Discharge Assessment: Vitals:   04/15/17 1233 04/15/17 1407  BP: 117/76   Pulse: 88   Resp: 18   Temp: 98.1 F (36.7 C)   SpO2: 91% 91%   Skin clean, dry and intact without evidence of skin break down, no evidence of skin tears noted. IV catheter discontinued intact. Site without signs and symptoms of complications - no redness or edema noted at insertion site, patient  denies c/o pain - only slight tenderness at site.  Dressing with slight pressure applied.  D/c Instructions-Education: Discharge instructions given to patient/family with verbalized understanding. D/c education completed with patient/family including follow up instructions, medication list, d/c activities limitations if indicated, with other d/c instructions as indicated by MD - patient able to verbalize understanding, all questions fully answered. Patient instructed to return to ED, call 911, or call MD for any changes in condition.  Patient escorted via WC, and D/C home via private auto.  Gracyn Santillanes Consuella Loselaine, RN 04/15/2017 4:48 PM

## 2017-04-18 LAB — CULTURE, BLOOD (ROUTINE X 2)
Culture: NO GROWTH
Culture: NO GROWTH
SPECIAL REQUESTS: ADEQUATE

## 2017-11-03 IMAGING — MR MR LUMBAR SPINE W/O CM
5 series · 44 of 48 positions shown · non-contrast
Comparison: MRI lumbar spine 06/10/2013.

CLINICAL DATA: Low back pain radiating into the right leg. Right
lower extremity weakness.

EXAM:
MRI LUMBAR SPINE WITHOUT CONTRAST
TECHNIQUE: Multiplanar, multisequence MR imaging of the lumbar spine was
performed. No intravenous contrast was administered.

[Series 3: tirm sag · sagittal · 4.0mm · 0.55mm/px · 6 of 13 slices shown]
[im 1/13]
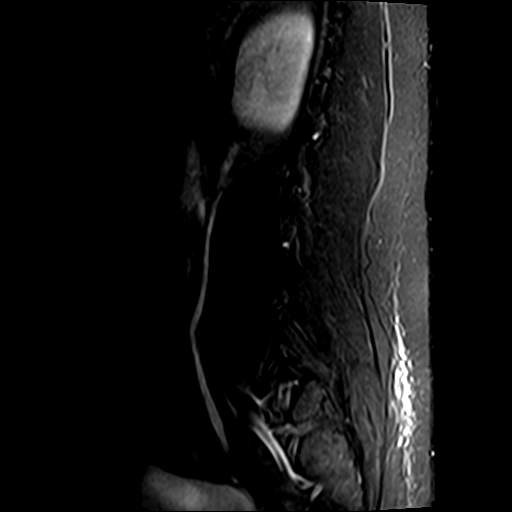
[im 3/13]
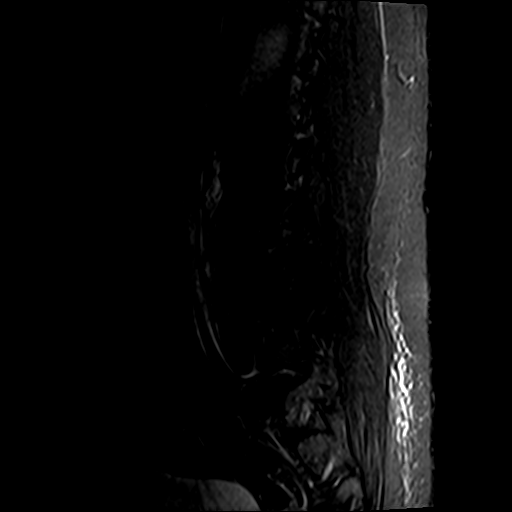
[im 5/13]
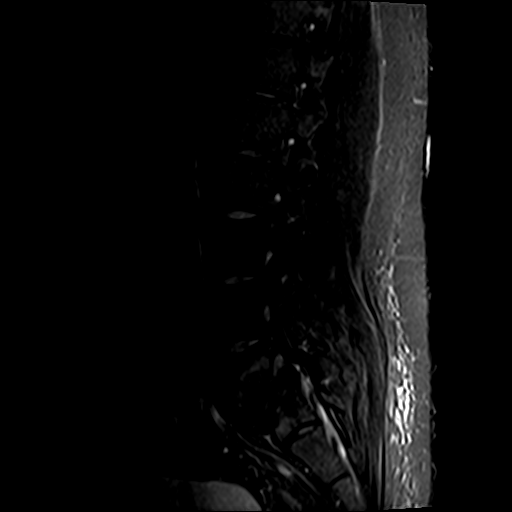
[im 8/13]
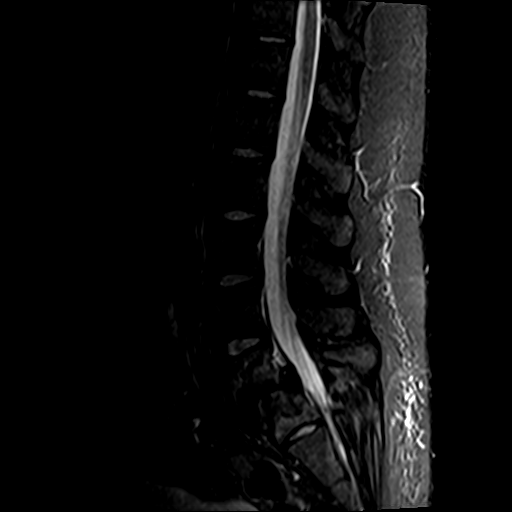
[im 10/13]
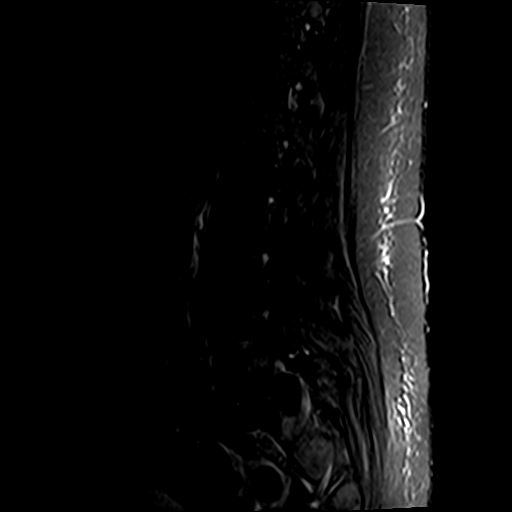
[im 13/13]
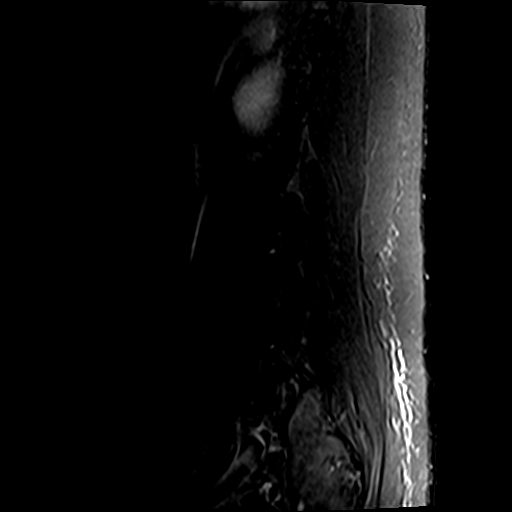

[Series 4: T2 · sagittal · 4.0mm · 0.88mm/px · 5 of 13 slices shown (1 of 2)]
[im 1/13]
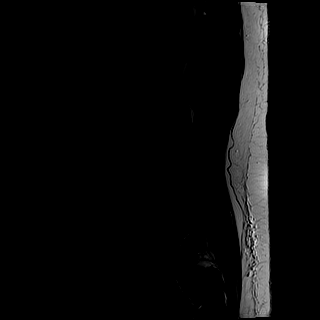
[im 4/13]
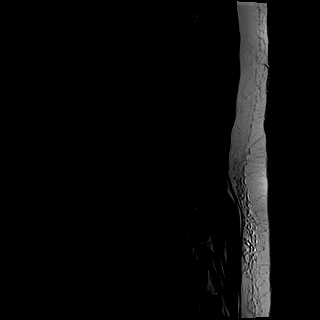
[im 7/13]
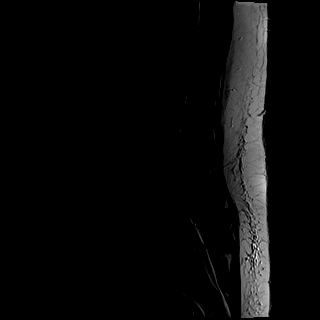
[im 10/13]
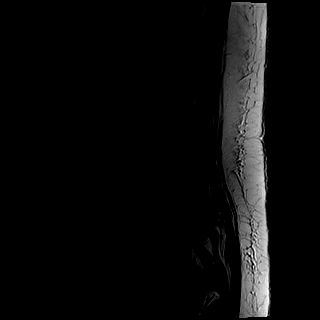
[im 13/13]
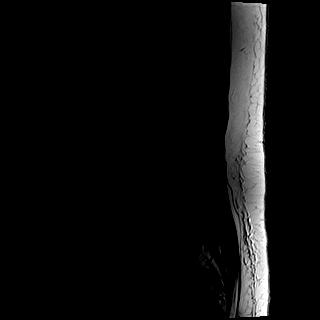

[Series 5: T1 · sagittal · 4.0mm · 0.88mm/px · 5 of 13 slices shown (1 of 2)]
[im 1/13]
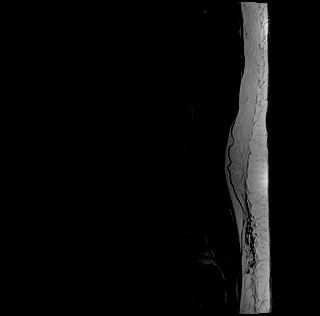
[im 4/13]
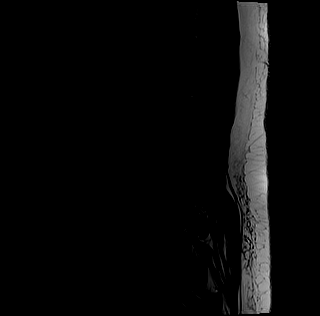
[im 7/13]
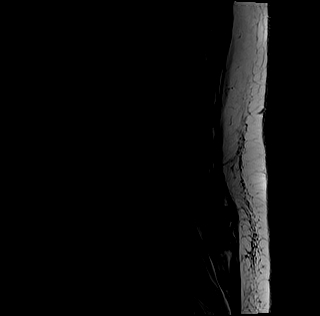
[im 10/13]
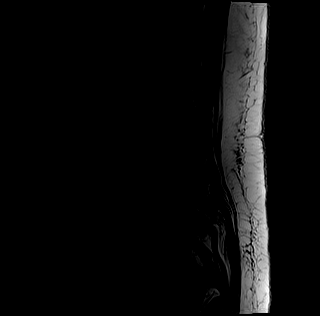
[im 13/13]
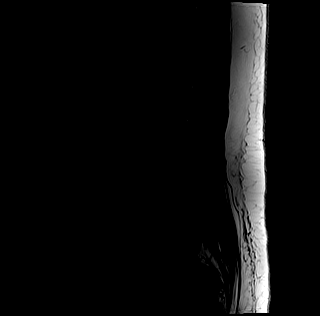

[Series 6: T1 · axial · 4.0mm · 0.78mm/px · z∈[-122,+110]mm · 12 of 42 slices shown (2 of 2)]
[im 1/42]
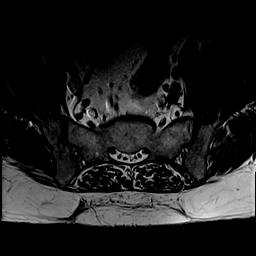
[im 3/42]
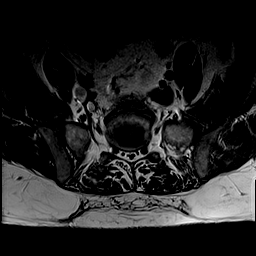
[im 6/42]
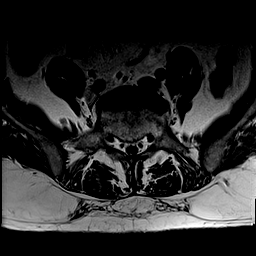
[im 9/42]
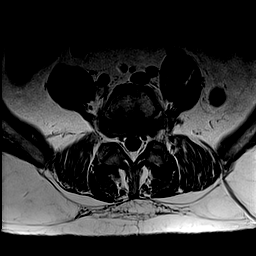
[im 11/42]
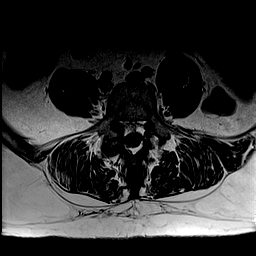
[im 14/42]
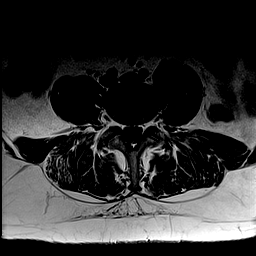
[im 20/42]
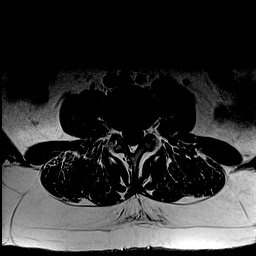
[im 22/42]
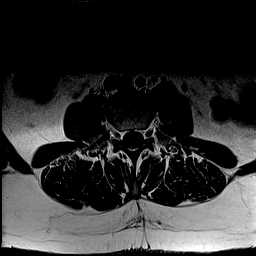
[im 25/42]
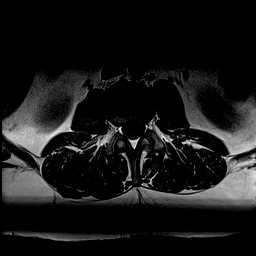
[im 31/42]
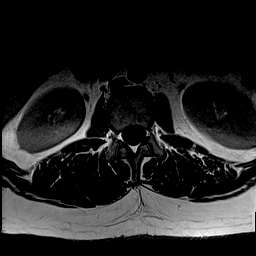
[im 36/42]
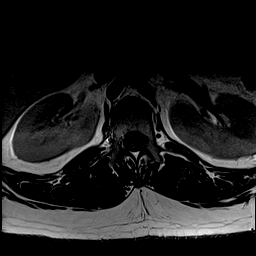
[im 42/42]
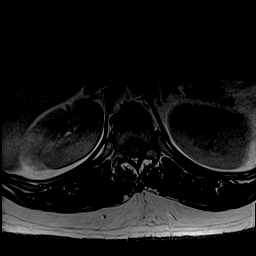

[Series 7: T2 · axial · 4.0mm · 0.78mm/px · z∈[-122,+110]mm · 16 of 42 slices shown (2 of 2)]
[im 1/42]
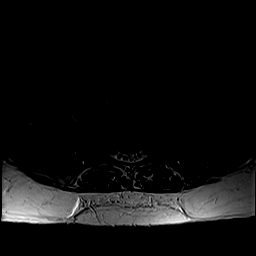
[im 3/42]
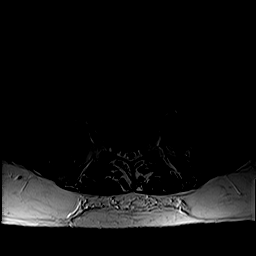
[im 6/42]
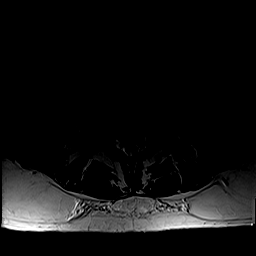
[im 9/42]
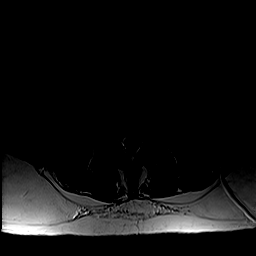
[im 11/42]
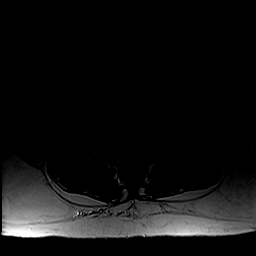
[im 14/42]
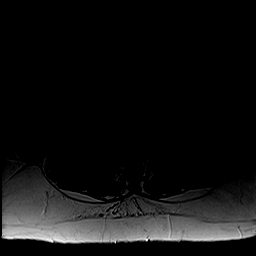
[im 17/42]
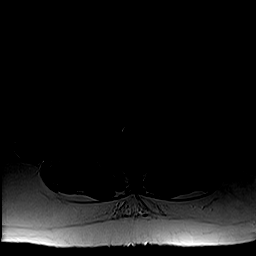
[im 20/42]
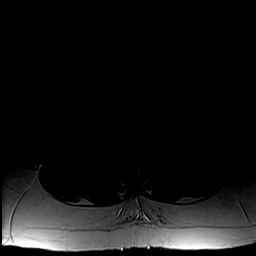
[im 22/42]
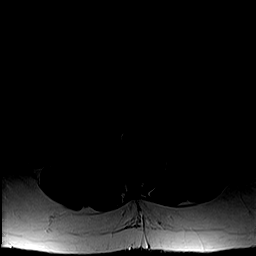
[im 25/42]
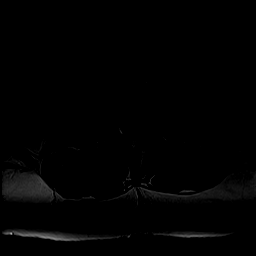
[im 28/42]
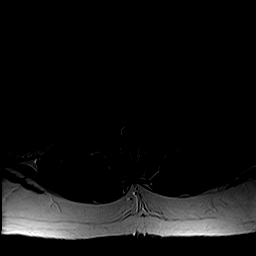
[im 31/42]
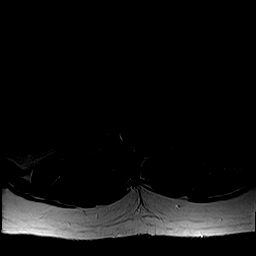
[im 33/42]
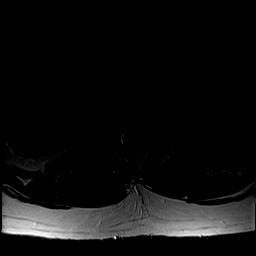
[im 36/42]
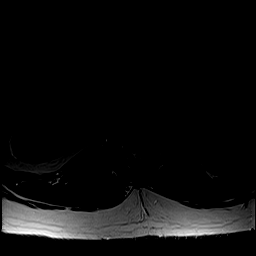
[im 39/42]
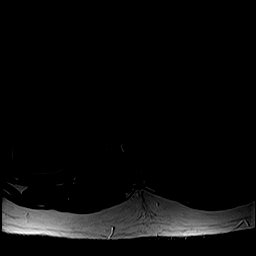
[im 42/42]
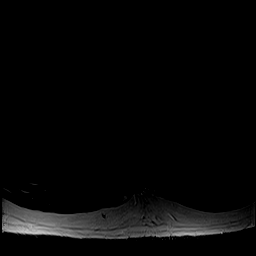

[44 of 48 positions shown; findings below may reference images not displayed]

FINDINGS: Segmentation: Rudimentary disc material is present at S1-2.
Numbering scheme is the same as that used on the prior examination
with the transitional segment labeled S1.

Alignment: Bilateral L5 pars interarticularis defects cause 1 cm
anterolisthesis L5 on S1, unchanged.

Vertebrae: Marked degenerative endplate signal change is present at
L5-S1. Schmorl's node in the superior endplate of S1 is noted.

Conus medullaris: Extends to the T12-L1 level and appears normal.

Paraspinal and other soft tissues: Negative.

Disc levels:

T11-12 is imaged in the sagittal plane only and negative.

T12-L1:  Negative.

L1-2:  Negative.

L2-3:  Negative.

L3-4:  Negative.

L4-5: Tiny central protrusion seen on the prior examination is
smaller. The central canal and foramina are widely patent.

L5-S1: Disc uncovering due to anterolisthesis is noted. The central
canal and lateral recesses are widely patent. Disc and
anterolisthesis cause severe right foraminal narrowing with
compression of the exiting right L5 root identified. Milder degree
of left foraminal narrowing is present at this level.
IMPRESSION: Transitional lumbosacral anatomy. Please see numbering scheme above
which is same as that used on the prior MRI.

No change in the appearance of the lumbar spine. Bilateral L5 pars
interarticularis defects result in 1 cm anterolisthesis L5 on S1.
Disc and anterolisthesis cause marked bilateral foraminal narrowing,
worse on the right. The exiting right L5 root is compressed. The
central canal is widely patent at this level.

Tiny central protrusion at L4-5 is smaller than on the prior MRI.
The central canal and foramina are open.

## 2022-09-26 ENCOUNTER — Other Ambulatory Visit: Payer: Self-pay | Admitting: Internal Medicine

## 2023-05-05 ENCOUNTER — Other Ambulatory Visit (HOSPITAL_BASED_OUTPATIENT_CLINIC_OR_DEPARTMENT_OTHER): Payer: Self-pay
